# Patient Record
Sex: Female | Born: 1935
Health system: Southern US, Community
[De-identification: ages and names within clinical notes are randomized; demographics above are authoritative.]

## PROBLEM LIST (undated history)

## (undated) DIAGNOSIS — Z8619 Personal history of other infectious and parasitic diseases: Secondary | ICD-10-CM

## (undated) DIAGNOSIS — D649 Anemia, unspecified: Secondary | ICD-10-CM

## (undated) DIAGNOSIS — M1990 Unspecified osteoarthritis, unspecified site: Secondary | ICD-10-CM

## (undated) DIAGNOSIS — E7800 Pure hypercholesterolemia, unspecified: Secondary | ICD-10-CM

## (undated) DIAGNOSIS — I10 Essential (primary) hypertension: Secondary | ICD-10-CM

## (undated) DIAGNOSIS — F419 Anxiety disorder, unspecified: Secondary | ICD-10-CM

## (undated) HISTORY — DX: Anemia, unspecified: D64.9

## (undated) HISTORY — PX: CATARACT EXTRACTION: SUR2

---

## 2004-11-30 ENCOUNTER — Ambulatory Visit (HOSPITAL_COMMUNITY): Admission: RE | Admit: 2004-11-30 | Discharge: 2004-11-30 | Payer: Self-pay | Admitting: Ophthalmology

## 2008-01-07 ENCOUNTER — Ambulatory Visit: Payer: Self-pay | Admitting: Cardiology

## 2008-03-26 ENCOUNTER — Ambulatory Visit: Payer: Self-pay | Admitting: Cardiology

## 2010-07-11 NOTE — Assessment & Plan Note (Signed)
[facility]                          [facility] OFFICE NOTE   NAME:Alison Roberson, Alison Roberson                   MRN:          [id number]  DATE:03/26/2008                            DOB:          02/14/1932    PRIMARY CARDIOLOGIST:  Learta Codding, MD, [facility]   REASON FOR VISIT:  Post hospital followup.   The patient presents to our clinic for the first time, after being  referred to [place] in consultation here at [facility] this past  November.  She presented with chest pain, deemed atypical by Dr. Andee Lineman,  and ruled out for myocardial infarction.  She was thus referred for an  adenosine stress Cardiolite, which was felt to be a low-risk study with  question of a very small area of possible apical ischemia; EF 72%.   Given this finding, and in the context of symptoms that were felt to be  atypical, Dr. Andee Lineman recommended clinic followup with the addition of  p.r.n. nitroglycerin.   Alison Roberson now presents for her post hospital followup.  She has not  had any recurrent symptoms of chest discomfort.  She has not smoked in  10 years.  She remains quite active and denies any exertion-related  symptoms.  Of note, she did not receive a prescription for nitroglycerin  and is also not on prophylactic aspirin.   CURRENT MEDICATIONS:  1. Lisinopril/HCTZ 10/12 0.5 daily.  2. Methotrexate 10 mg weekly.  3. Simvastatin 10 daily.  4. Amlodipine 10 daily.   PHYSICAL EXAMINATION:  VITAL SIGNS:  Blood pressure 118/66, pulse 76,  regular weight 135.  GENERAL:  A 77 year old female, sitting upright, no distress.  HEENT:  Normocephalic, atraumatic.  NECK:  Palpable, bilateral carotid pulses without bruits.  LUNGS:  Diminished breath sounds with faint expiratory wheezes.  HEART:  Regular rate and rhythm.  No significant murmurs.  ABDOMEN:  Benign.  EXTREMITIES:  No edema.  NEUROLOGIC:  No focal deficit.   IMPRESSION:  1. Status post atypical chest pain.   a.     Low-risk adenosine stress Cardiolite; EF 62%, November 2009.  2. COPD/history of tobacco.  3. Hypertension, well controlled.  4. Rheumatoid arthritis.  5. Dyslipidemia.   PLAN:  1. The patient is advised to start low-dose aspirin for primary      prevention.  We will also provide her with a prescription for      p.r.n. nitroglycerin, as previously recommended.  2. No further cardiac testing indicated at this point in time.  We      will, therefore, have the patient return to Dr. Lewayne Bunting on an as      needed basis.  She is to maintain regular scheduled followup with      Dr. Olena Leatherwood.      Gene Serpe, PA-C  Electronically Signed      Learta Codding, MD,FACC  Electronically Signed   GS/MedQ  DD: 03/26/2008  DT: 03/27/2008  Job #: [id number]

## 2010-07-14 NOTE — Op Note (Signed)
NAMEMYRAKLE, Alison Roberson            ACCOUNT NO.:  [id number]   MEDICAL RECORD NO.:  [id number]          PATIENT TYPE:  AMB   LOCATION:  DAY                           FACILITY:  [facility]   PHYSICIAN:  Trish Fountain, MD    DATE OF BIRTH:  1932/01/30   DATE OF PROCEDURE:  11/30/2004  DATE OF DISCHARGE:                                 OPERATIVE REPORT   /PREOPERATIVE DIAGNOSIS:  Cataract, left eye.   POSTOPERATIVE DIAGNOSIS:  Cataract, left eye.   SURGERY:  Kelman phacoemulsification, left eye, with posterior chamber  intraocular lens, left eye.   ANESTHESIA:  MAC with topical anesthesia of the left eye.   SURGEON:  Trish Fountain, MD   SPECIMENS:  None.   COMPLICATIONS:  None.   HISTORY:  This is a 77 year old female who has slowly progressive decrease  in vision in the left eye.   LENS MODEL:  AMO CLRFLXC, 21.0 diopter lens, serial #[id number].   DESCRIPTION OF PROCEDURE:  In the preoperative area, the patient had  Cyclogyl and Neo-Synephrine drops in the left eye in order to dilate the eye  along with Tetracaine to help anesthetize the eye.  Once the patient's left  eye was dilated, the patient was taken to the operating room and prepped.  The left eye was prepped and draped in the usual sterile manner.  A lid  speculum was placed in the left eye, and 2% Xylocaine jelly was placed in  the left eye as well.  A paracentesis was made through clear cornea at the  limbus at approximately the 5 o'clock position of the left eye.  Nonpreserved Xylocaine 1% 1 cc was placed into the anterior chamber for one  minute.  Viscoat was then used to fill the anterior chamber.  Using a 2.75  mm blade at the 3 o'clock position, an incision into the anterior chamber  was made through clear cornea near the limbus.  Viscoat was again used to  reform the anterior chamber.  A 25 gauge bent capsulotomy needle was used to  begin the capsulorrhexis through the anterior capsule of the lens.  Utrata  forceps were used to make a 360 degree anterior capsulorrhexis.  A Chang 27  gauge irrigating cannula was used to hydrodissect and hydrodelineate the  nucleus.  Once hydrodissection and hydrodelineation was carried out, [facility]  phacoemulsification was used to make a deep groove in the lens nucleus.  The  lens was rotated 360 degrees and divided into four quadrants using deep  grooves made by phacoemulsification with the [facility] phacoemulsification tip.  The nucleus was then divided using the phaco tip and the nucleus  manipulator.  The nuclear quadrants were then removed using  phacoemulsification.  The irrigation aspiration was then used to remove the  remainder of the cortex. The anterior chamber and posterior capsule was  filled with Provisc, and the 3 o'clock position incision was slightly  widened, using the same 2.75 mm blade that was initially used to make the  incision.  An intraocular lens was placed in the shooter, and this was  placed in the eye, followed  by placement of the trailing haptic into the  posterior capsule, using the Kugelan.  Irrigation/aspiration was then used  to remove Provisc from the anterior chamber and the posterior capsule.  BSS  on a syringe was then used to hydrate the cornea at the 3 o'clock incision  site.  The incision site was then checked for water tightness, using a Weck-  cel.  Half-strength Betadine solution was placed, 1 drop, in the inner  canthus, and 1 drop in the outer canthus.  After one minute, this was rinsed  from the eye.  Drops were placed in the eye, Vigamox, followed by Nevanac  followed by Econopred.  A shield was placed over the patient's left eye, and  the patient was sent to the recovery room in satisfactory condition.      Trish Fountain, MD  Electronically Signed     PVK/MEDQ  D:  12/01/2004  T:  12/01/2004  Job:  [id number]

## 2015-03-24 DIAGNOSIS — M25511 Pain in right shoulder: Secondary | ICD-10-CM | POA: Diagnosis not present

## 2015-03-24 DIAGNOSIS — Z6822 Body mass index (BMI) 22.0-22.9, adult: Secondary | ICD-10-CM | POA: Diagnosis not present

## 2015-04-15 DIAGNOSIS — Z6822 Body mass index (BMI) 22.0-22.9, adult: Secondary | ICD-10-CM | POA: Diagnosis not present

## 2015-04-15 DIAGNOSIS — M25511 Pain in right shoulder: Secondary | ICD-10-CM | POA: Diagnosis not present

## 2015-04-15 DIAGNOSIS — J440 Chronic obstructive pulmonary disease with acute lower respiratory infection: Secondary | ICD-10-CM | POA: Diagnosis not present

## 2015-05-23 DIAGNOSIS — M4722 Other spondylosis with radiculopathy, cervical region: Secondary | ICD-10-CM | POA: Diagnosis not present

## 2015-05-23 DIAGNOSIS — M5030 Other cervical disc degeneration, unspecified cervical region: Secondary | ICD-10-CM | POA: Diagnosis not present

## 2015-05-23 DIAGNOSIS — M67911 Unspecified disorder of synovium and tendon, right shoulder: Secondary | ICD-10-CM | POA: Diagnosis not present

## 2015-06-04 DIAGNOSIS — Z79899 Other long term (current) drug therapy: Secondary | ICD-10-CM | POA: Diagnosis not present

## 2015-06-04 DIAGNOSIS — E875 Hyperkalemia: Secondary | ICD-10-CM | POA: Diagnosis not present

## 2015-06-04 DIAGNOSIS — M1990 Unspecified osteoarthritis, unspecified site: Secondary | ICD-10-CM | POA: Diagnosis not present

## 2015-06-04 DIAGNOSIS — J449 Chronic obstructive pulmonary disease, unspecified: Secondary | ICD-10-CM | POA: Diagnosis not present

## 2015-06-04 DIAGNOSIS — N178 Other acute kidney failure: Secondary | ICD-10-CM | POA: Diagnosis not present

## 2015-06-04 DIAGNOSIS — D638 Anemia in other chronic diseases classified elsewhere: Secondary | ICD-10-CM | POA: Diagnosis not present

## 2015-06-04 DIAGNOSIS — I10 Essential (primary) hypertension: Secondary | ICD-10-CM | POA: Diagnosis not present

## 2015-06-04 DIAGNOSIS — Z780 Asymptomatic menopausal state: Secondary | ICD-10-CM | POA: Diagnosis not present

## 2015-06-04 DIAGNOSIS — J441 Chronic obstructive pulmonary disease with (acute) exacerbation: Secondary | ICD-10-CM | POA: Diagnosis not present

## 2015-06-04 DIAGNOSIS — N179 Acute kidney failure, unspecified: Secondary | ICD-10-CM | POA: Diagnosis not present

## 2015-06-04 DIAGNOSIS — R42 Dizziness and giddiness: Secondary | ICD-10-CM | POA: Diagnosis not present

## 2015-06-04 DIAGNOSIS — Z7982 Long term (current) use of aspirin: Secondary | ICD-10-CM | POA: Diagnosis not present

## 2015-06-04 DIAGNOSIS — E860 Dehydration: Secondary | ICD-10-CM | POA: Diagnosis not present

## 2015-06-04 DIAGNOSIS — M150 Primary generalized (osteo)arthritis: Secondary | ICD-10-CM | POA: Diagnosis not present

## 2015-06-14 DIAGNOSIS — Z6822 Body mass index (BMI) 22.0-22.9, adult: Secondary | ICD-10-CM | POA: Diagnosis not present

## 2015-06-14 DIAGNOSIS — N2889 Other specified disorders of kidney and ureter: Secondary | ICD-10-CM | POA: Diagnosis not present

## 2015-06-15 DIAGNOSIS — N2889 Other specified disorders of kidney and ureter: Secondary | ICD-10-CM | POA: Diagnosis not present

## 2015-06-15 DIAGNOSIS — Z79899 Other long term (current) drug therapy: Secondary | ICD-10-CM | POA: Diagnosis not present

## 2015-06-20 DIAGNOSIS — M5030 Other cervical disc degeneration, unspecified cervical region: Secondary | ICD-10-CM | POA: Diagnosis not present

## 2015-06-20 DIAGNOSIS — M4722 Other spondylosis with radiculopathy, cervical region: Secondary | ICD-10-CM | POA: Diagnosis not present

## 2015-06-20 DIAGNOSIS — M67911 Unspecified disorder of synovium and tendon, right shoulder: Secondary | ICD-10-CM | POA: Diagnosis not present

## 2015-06-25 ENCOUNTER — Encounter (HOSPITAL_COMMUNITY): Payer: Self-pay | Admitting: Emergency Medicine

## 2015-06-25 ENCOUNTER — Emergency Department (HOSPITAL_COMMUNITY)
Admission: EM | Admit: 2015-06-25 | Discharge: 2015-06-25 | Disposition: A | Discharge status: Home / Self Care | Payer: Medicare Other | Attending: Emergency Medicine | Admitting: Emergency Medicine

## 2015-06-25 ENCOUNTER — Emergency Department (HOSPITAL_COMMUNITY): Payer: Medicare Other

## 2015-06-25 DIAGNOSIS — Z79899 Other long term (current) drug therapy: Secondary | ICD-10-CM | POA: Diagnosis not present

## 2015-06-25 DIAGNOSIS — N281 Cyst of kidney, acquired: Secondary | ICD-10-CM | POA: Diagnosis not present

## 2015-06-25 DIAGNOSIS — I10 Essential (primary) hypertension: Secondary | ICD-10-CM | POA: Diagnosis not present

## 2015-06-25 DIAGNOSIS — M549 Dorsalgia, unspecified: Secondary | ICD-10-CM | POA: Diagnosis present

## 2015-06-25 DIAGNOSIS — R1010 Upper abdominal pain, unspecified: Secondary | ICD-10-CM | POA: Diagnosis not present

## 2015-06-25 DIAGNOSIS — Z87891 Personal history of nicotine dependence: Secondary | ICD-10-CM | POA: Diagnosis not present

## 2015-06-25 DIAGNOSIS — R1013 Epigastric pain: Secondary | ICD-10-CM | POA: Diagnosis not present

## 2015-06-25 HISTORY — DX: Essential (primary) hypertension: I10

## 2015-06-25 HISTORY — DX: Pure hypercholesterolemia, unspecified: E78.00

## 2015-06-25 HISTORY — DX: Unspecified osteoarthritis, unspecified site: M19.90

## 2015-06-25 LAB — URINALYSIS, ROUTINE W REFLEX MICROSCOPIC
Glucose, UA: NEGATIVE mg/dL
Hgb urine dipstick: NEGATIVE
Ketones, ur: 15 mg/dL — AB
Nitrite: NEGATIVE
Protein, ur: 100 mg/dL — AB
Specific Gravity, Urine: 1.025 (ref 1.005–1.030)
pH: 5.5 (ref 5.0–8.0)

## 2015-06-25 LAB — COMPREHENSIVE METABOLIC PANEL
ALT: 14 U/L (ref 14–54)
AST: 21 U/L (ref 15–41)
Albumin: 4 g/dL (ref 3.5–5.0)
Alkaline Phosphatase: 66 U/L (ref 38–126)
Anion gap: 9 (ref 5–15)
BUN: 32 mg/dL — ABNORMAL HIGH (ref 6–20)
CO2: 19 mmol/L — ABNORMAL LOW (ref 22–32)
Calcium: 9.4 mg/dL (ref 8.9–10.3)
Chloride: 105 mmol/L (ref 101–111)
Creatinine, Ser: 1.93 mg/dL — ABNORMAL HIGH (ref 0.44–1.00)
GFR calc Af Amer: 27 mL/min — ABNORMAL LOW (ref >60)
GFR calc non Af Amer: 23 mL/min — ABNORMAL LOW (ref >60)
Glucose, Bld: 142 mg/dL — ABNORMAL HIGH (ref 65–99)
Potassium: 5.2 mmol/L — ABNORMAL HIGH (ref 3.5–5.1)
Sodium: 133 mmol/L — ABNORMAL LOW (ref 135–145)
Total Bilirubin: 0.3 mg/dL (ref 0.3–1.2)
Total Protein: 7.7 g/dL (ref 6.5–8.1)

## 2015-06-25 LAB — CBC WITH DIFFERENTIAL/PLATELET
Basophils Absolute: 0 10*3/uL (ref 0.0–0.1)
Basophils Relative: 0 %
Eosinophils Absolute: 0.1 10*3/uL (ref 0.0–0.7)
Eosinophils Relative: 1 %
HCT: 34 % — ABNORMAL LOW (ref 36.0–46.0)
Hemoglobin: 11.1 g/dL — ABNORMAL LOW (ref 12.0–15.0)
Lymphocytes Relative: 6 %
Lymphs Abs: 0.4 10*3/uL — ABNORMAL LOW (ref 0.7–4.0)
MCH: 31.4 pg (ref 26.0–34.0)
MCHC: 32.6 g/dL (ref 30.0–36.0)
MCV: 96.3 fL (ref 78.0–100.0)
Monocytes Absolute: 0.9 10*3/uL (ref 0.1–1.0)
Monocytes Relative: 13 %
Neutro Abs: 5.5 10*3/uL (ref 1.7–7.7)
Neutrophils Relative %: 80 %
Platelets: 417 10*3/uL — ABNORMAL HIGH (ref 150–400)
RBC: 3.53 MIL/uL — ABNORMAL LOW (ref 3.87–5.11)
RDW: 13.5 % (ref 11.5–15.5)
WBC: 6.9 10*3/uL (ref 4.0–10.5)

## 2015-06-25 LAB — URINE MICROSCOPIC-ADD ON

## 2015-06-25 LAB — LIPASE, BLOOD: Lipase: 35 U/L (ref 11–51)

## 2015-06-25 MED ORDER — HYDROMORPHONE HCL 1 MG/ML IJ SOLN
0.7500 mg | Freq: Once | INTRAMUSCULAR | Status: AC
  Administered 2015-06-25: 0.75 mg via INTRAVENOUS
  Filled 2015-06-25: qty 1

## 2015-06-25 MED ORDER — ONDANSETRON HCL 4 MG/2ML IJ SOLN
4.0000 mg | Freq: Once | INTRAMUSCULAR | Status: AC
  Administered 2015-06-25: 4 mg via INTRAVENOUS
  Filled 2015-06-25: qty 2

## 2015-06-25 MED ORDER — SODIUM CHLORIDE 0.9 % IV BOLUS (SEPSIS)
1000.0000 mL | Freq: Once | INTRAVENOUS | Status: AC
  Administered 2015-06-25: 1000 mL via INTRAVENOUS

## 2015-06-25 MED ORDER — DIATRIZOATE MEGLUMINE & SODIUM 66-10 % PO SOLN
ORAL | Status: AC
  Administered 2015-06-25: 30 mL
  Filled 2015-06-25: qty 30

## 2015-06-25 MED ORDER — TRAMADOL HCL 50 MG PO TABS: 50.0000 mg | ORAL_TABLET | Freq: Three times a day (TID) | ORAL | Status: DC | PRN

## 2015-06-25 MED ORDER — HYDROMORPHONE HCL 1 MG/ML IJ SOLN
0.5000 mg | Freq: Once | INTRAMUSCULAR | Status: AC
  Administered 2015-06-25: 0.5 mg via INTRAVENOUS
  Filled 2015-06-25: qty 1

## 2015-06-25 NOTE — Discharge Instructions (Signed)

## 2015-06-25 NOTE — ED Notes (Signed)
Patient c/o mid back pain that radiates into abd. Denies any nausea, vomiting, diarrhea, or urinary symptoms. Patient reports having a BM everyday but has not had one since Thursday.

## 2015-07-04 DIAGNOSIS — B028 Zoster with other complications: Secondary | ICD-10-CM | POA: Diagnosis not present

## 2015-07-04 DIAGNOSIS — Z6820 Body mass index (BMI) 20.0-20.9, adult: Secondary | ICD-10-CM | POA: Diagnosis not present

## 2015-07-04 NOTE — ED Provider Notes (Signed)
CSN: [id number]     Arrival date & time 06/25/15  1542 History   First MD Initiated Contact with Patient 06/25/15 1600     Chief Complaint  Patient presents with  . Back Pain     (Consider location/radiation/quality/duration/timing/severity/associated sxs/prior Treatment) HPI    82 year old female with abdominal pain. Pain is afebrile abdomen and radiates into the mid back. Onset was 2 days ago. Persistent since then. Progressive. She normally has pretty regular bowel movements but has not had one since Thursday. She questions were pain may be related to this. No nausea or vomiting. No urinary complaints. No fevers or chills. Does not really feel distended. Has not tried taking anything for her symptoms.  Past Medical History  Diagnosis Date  . Hypertension   . High cholesterol   . Arthritis    Past Surgical History  Procedure Laterality Date  . Cataract extraction     Family History  Problem Relation Age of Onset  . Diabetes Sister   . Cancer Brother    Social History  Substance Use Topics  . Smoking status: Former Smoker -- 0.02 packs/day for 40 years    Types: Cigarettes    Quit date: 02/26/1997  . Smokeless tobacco: Never Used  . Alcohol Use: No   OB History    Gravida Para Term Preterm AB TAB SAB Ectopic Multiple Living   '5 5 5       5     '$ Review of Systems  All systems reviewed and negative, other than as noted in HPI.   Allergies  Review of patient's allergies indicates no known allergies.  Home Medications   Prior to Admission medications   Medication Sig Start Date End Date Taking? Authorizing Provider  acetaminophen (TYLENOL) 650 MG CR tablet Take 650 mg by mouth every 8 (eight) hours as needed for pain.   Yes Historical Provider, MD  albuterol (PROAIR HFA) 108 (90 Base) MCG/ACT inhaler Inhale 1-2 puffs into the lungs every 6 (six) hours as needed for wheezing or shortness of breath.   Yes Historical Provider, MD  alendronate (FOSAMAX) 70 MG tablet  Take 70 mg by mouth every Monday. 04/15/15  Yes Historical Provider, MD  amLODipine (NORVASC) 10 MG tablet Take 10 mg by mouth every morning.   Yes Historical Provider, MD  aspirin EC 81 MG tablet Take 81 mg by mouth every morning.   Yes Historical Provider, MD  folic acid (FOLVITE) 1 MG tablet Take 1 mg by mouth every morning.   Yes Historical Provider, MD  metoprolol (LOPRESSOR) 50 MG tablet Take 50 mg by mouth 2 (two) times daily.   Yes Historical Provider, MD  simvastatin (ZOCOR) 10 MG tablet Take 10 mg by mouth every morning.   Yes Historical Provider, MD  tiZANidine (ZANAFLEX) 4 MG tablet Take 4 mg by mouth daily as needed for muscle spasms.   Yes Historical Provider, MD  traMADol (ULTRAM) 50 MG tablet Take 1 tablet (50 mg total) by mouth every 8 (eight) hours as needed for moderate pain or severe pain. 06/25/15   Virgel Manifold, MD   BP 118/71 mmHg  Pulse 88  Temp(Src) 98.3 F (36.8 C) (Oral)  Resp 15  Ht 5' (1.524 m)  Wt 110 lb (49.896 kg)  BMI 21.48 kg/m2  SpO2 95% Physical Exam  Constitutional: She appears well-developed and well-nourished. No distress.  HENT:  Head: Normocephalic and atraumatic.  Eyes: Conjunctivae are normal. Right eye exhibits no discharge. Left eye exhibits no discharge.  Neck: Neck supple.  Cardiovascular: Normal rate, regular rhythm and normal heart sounds.  Exam reveals no gallop and no friction rub.   No murmur heard. Pulmonary/Chest: Effort normal and breath sounds normal. No respiratory distress.  Abdominal: Soft. She exhibits no distension. There is tenderness.  Mild to moderate tenderness in the upper abdomen, slightly worse in the epigastrium.  Musculoskeletal: She exhibits no edema or tenderness.  Neurological: She is alert.  Skin: Skin is warm and dry.  Psychiatric: She has a normal mood and affect. Her behavior is normal. Thought content normal.  Nursing note and vitals reviewed.   ED Course  Procedures (including critical care time) Labs  Review Labs Reviewed  CBC WITH DIFFERENTIAL/PLATELET - Abnormal; Notable for the following:    RBC 3.53 (*)    Hemoglobin 11.1 (*)    HCT 34.0 (*)    Platelets 417 (*)    Lymphs Abs 0.4 (*)    All other components within normal limits  COMPREHENSIVE METABOLIC PANEL - Abnormal; Notable for the following:    Sodium 133 (*)    Potassium 5.2 (*)    CO2 19 (*)    Glucose, Bld 142 (*)    BUN 32 (*)    Creatinine, Ser 1.93 (*)    GFR calc non Af Amer 23 (*)    GFR calc Af Amer 27 (*)    All other components within normal limits  URINALYSIS, ROUTINE W REFLEX MICROSCOPIC (NOT AT [facility]) - Abnormal; Notable for the following:    Bilirubin Urine SMALL (*)    Ketones, ur 15 (*)    Protein, ur 100 (*)    Leukocytes, UA TRACE (*)    All other components within normal limits  URINE MICROSCOPIC-ADD ON - Abnormal; Notable for the following:    Squamous Epithelial / LPF 0-5 (*)    Bacteria, UA FEW (*)    Casts GRANULAR CAST (*)    All other components within normal limits  LIPASE, BLOOD    Imaging Review No results found.   Ct Abdomen Pelvis Wo Contrast  06/25/2015  CLINICAL DATA:  Acute onset of mid back pain, radiating to the abdomen. Initial encounter. EXAM: CT ABDOMEN AND PELVIS WITHOUT CONTRAST TECHNIQUE: Multidetector CT imaging of the abdomen and pelvis was performed following the standard protocol without IV contrast. COMPARISON:  None. FINDINGS: Mild scarring is noted at the right middle lung lobe. A small hiatal hernia is noted, filled with contrast. Diffuse coronary artery calcifications are seen. Calcification is also noted at the aortic valve. The liver and spleen are unremarkable in appearance. The gallbladder is within normal limits. The pancreas and adrenal glands are unremarkable. A 1.3 cm cyst is noted at the interpole region of the left kidney. There is no evidence of hydronephrosis. No renal or ureteral stones are seen. Mild nonspecific perinephric stranding is noted bilaterally.  No free fluid is identified. The small bowel is unremarkable in appearance. The stomach is within normal limits. No acute vascular abnormalities are seen. Scattered calcification is seen along the abdominal aorta and its branches. The appendix is normal in caliber, without evidence of appendicitis. The cecum is flipped anteriorly. The transverse colon is mildly redundant. The colon is grossly unremarkable in appearance. The bladder is mildly distended and grossly unremarkable. The uterus is unremarkable in appearance. The ovaries are grossly symmetric. No suspicious adnexal masses are seen. No inguinal lymphadenopathy is seen. No acute osseous abnormalities are identified. There is mild grade 1 retrolisthesis of L1 on  L2, of L2 on L3, and of L3 on L4. Multilevel vacuum phenomenon is noted along the lumbar spine. IMPRESSION: 1. No acute abnormality seen to explain the patient's symptoms. 2. Scattered calcification along the abdominal aorta and its branches. 3. Diffuse coronary artery calcifications seen. Calcification at the aortic valve. 4. Small hiatal hernia noted, filled with contrast. 5. Small left renal cyst seen. 6. Mild degenerative change along the lumbar spine. Electronically Signed   By: Garald Balding M.D.   On: 06/25/2015 19:09   I have personally reviewed and evaluated these images and lab results as part of my medical decision-making.   EKG Interpretation None      MDM   Final diagnoses:  Pain of upper abdomen        Virgel Manifold, MD 07/04/15 1525

## 2015-09-15 DIAGNOSIS — M818 Other osteoporosis without current pathological fracture: Secondary | ICD-10-CM | POA: Diagnosis not present

## 2015-09-15 DIAGNOSIS — I10 Essential (primary) hypertension: Secondary | ICD-10-CM | POA: Diagnosis not present

## 2015-09-15 DIAGNOSIS — B028 Zoster with other complications: Secondary | ICD-10-CM | POA: Diagnosis not present

## 2015-09-15 DIAGNOSIS — E784 Other hyperlipidemia: Secondary | ICD-10-CM | POA: Diagnosis not present

## 2015-12-15 DIAGNOSIS — E784 Other hyperlipidemia: Secondary | ICD-10-CM | POA: Diagnosis not present

## 2015-12-15 DIAGNOSIS — Z1389 Encounter for screening for other disorder: Secondary | ICD-10-CM | POA: Diagnosis not present

## 2015-12-15 DIAGNOSIS — N182 Chronic kidney disease, stage 2 (mild): Secondary | ICD-10-CM | POA: Diagnosis not present

## 2015-12-15 DIAGNOSIS — B028 Zoster with other complications: Secondary | ICD-10-CM | POA: Diagnosis not present

## 2015-12-15 DIAGNOSIS — M818 Other osteoporosis without current pathological fracture: Secondary | ICD-10-CM | POA: Diagnosis not present

## 2015-12-15 DIAGNOSIS — I10 Essential (primary) hypertension: Secondary | ICD-10-CM | POA: Diagnosis not present

## 2015-12-15 DIAGNOSIS — Z0000 Encounter for general adult medical examination without abnormal findings: Secondary | ICD-10-CM | POA: Diagnosis not present

## 2015-12-26 DIAGNOSIS — H2511 Age-related nuclear cataract, right eye: Secondary | ICD-10-CM | POA: Diagnosis not present

## 2015-12-26 DIAGNOSIS — H538 Other visual disturbances: Secondary | ICD-10-CM | POA: Diagnosis not present

## 2015-12-28 DIAGNOSIS — I10 Essential (primary) hypertension: Secondary | ICD-10-CM | POA: Diagnosis not present

## 2015-12-28 DIAGNOSIS — E784 Other hyperlipidemia: Secondary | ICD-10-CM | POA: Diagnosis not present

## 2016-01-27 DIAGNOSIS — E784 Other hyperlipidemia: Secondary | ICD-10-CM | POA: Diagnosis not present

## 2016-01-27 DIAGNOSIS — I10 Essential (primary) hypertension: Secondary | ICD-10-CM | POA: Diagnosis not present

## 2016-02-29 DIAGNOSIS — E784 Other hyperlipidemia: Secondary | ICD-10-CM | POA: Diagnosis not present

## 2016-02-29 DIAGNOSIS — I10 Essential (primary) hypertension: Secondary | ICD-10-CM | POA: Diagnosis not present

## 2016-03-22 DIAGNOSIS — M818 Other osteoporosis without current pathological fracture: Secondary | ICD-10-CM | POA: Diagnosis not present

## 2016-03-22 DIAGNOSIS — Z0000 Encounter for general adult medical examination without abnormal findings: Secondary | ICD-10-CM | POA: Diagnosis not present

## 2016-03-22 DIAGNOSIS — I10 Essential (primary) hypertension: Secondary | ICD-10-CM | POA: Diagnosis not present

## 2016-03-22 DIAGNOSIS — N182 Chronic kidney disease, stage 2 (mild): Secondary | ICD-10-CM | POA: Diagnosis not present

## 2016-03-22 DIAGNOSIS — B028 Zoster with other complications: Secondary | ICD-10-CM | POA: Diagnosis not present

## 2016-03-22 DIAGNOSIS — E784 Other hyperlipidemia: Secondary | ICD-10-CM | POA: Diagnosis not present

## 2016-04-13 DIAGNOSIS — I10 Essential (primary) hypertension: Secondary | ICD-10-CM | POA: Diagnosis not present

## 2016-04-13 DIAGNOSIS — E784 Other hyperlipidemia: Secondary | ICD-10-CM | POA: Diagnosis not present

## 2016-05-11 DIAGNOSIS — E784 Other hyperlipidemia: Secondary | ICD-10-CM | POA: Diagnosis not present

## 2016-05-11 DIAGNOSIS — I10 Essential (primary) hypertension: Secondary | ICD-10-CM | POA: Diagnosis not present

## 2016-06-11 DIAGNOSIS — I10 Essential (primary) hypertension: Secondary | ICD-10-CM | POA: Diagnosis not present

## 2016-06-11 DIAGNOSIS — E784 Other hyperlipidemia: Secondary | ICD-10-CM | POA: Diagnosis not present

## 2016-06-22 DIAGNOSIS — I10 Essential (primary) hypertension: Secondary | ICD-10-CM | POA: Diagnosis not present

## 2016-06-22 DIAGNOSIS — M818 Other osteoporosis without current pathological fracture: Secondary | ICD-10-CM | POA: Diagnosis not present

## 2016-06-22 DIAGNOSIS — Z0000 Encounter for general adult medical examination without abnormal findings: Secondary | ICD-10-CM | POA: Diagnosis not present

## 2016-06-22 DIAGNOSIS — E784 Other hyperlipidemia: Secondary | ICD-10-CM | POA: Diagnosis not present

## 2016-06-22 DIAGNOSIS — N182 Chronic kidney disease, stage 2 (mild): Secondary | ICD-10-CM | POA: Diagnosis not present

## 2016-06-22 DIAGNOSIS — Z1389 Encounter for screening for other disorder: Secondary | ICD-10-CM | POA: Diagnosis not present

## 2016-07-11 DIAGNOSIS — I10 Essential (primary) hypertension: Secondary | ICD-10-CM | POA: Diagnosis not present

## 2016-07-11 DIAGNOSIS — E784 Other hyperlipidemia: Secondary | ICD-10-CM | POA: Diagnosis not present

## 2016-07-19 DIAGNOSIS — I10 Essential (primary) hypertension: Secondary | ICD-10-CM | POA: Diagnosis not present

## 2016-07-19 DIAGNOSIS — Z79899 Other long term (current) drug therapy: Secondary | ICD-10-CM | POA: Diagnosis not present

## 2016-07-19 DIAGNOSIS — Z780 Asymptomatic menopausal state: Secondary | ICD-10-CM | POA: Diagnosis not present

## 2016-07-19 DIAGNOSIS — E7800 Pure hypercholesterolemia, unspecified: Secondary | ICD-10-CM | POA: Diagnosis not present

## 2016-07-19 DIAGNOSIS — M810 Age-related osteoporosis without current pathological fracture: Secondary | ICD-10-CM | POA: Diagnosis not present

## 2016-07-19 DIAGNOSIS — Z7982 Long term (current) use of aspirin: Secondary | ICD-10-CM | POA: Diagnosis not present

## 2016-07-19 DIAGNOSIS — Z87891 Personal history of nicotine dependence: Secondary | ICD-10-CM | POA: Diagnosis not present

## 2016-08-01 DIAGNOSIS — E784 Other hyperlipidemia: Secondary | ICD-10-CM | POA: Diagnosis not present

## 2016-08-01 DIAGNOSIS — I10 Essential (primary) hypertension: Secondary | ICD-10-CM | POA: Diagnosis not present

## 2016-09-06 DIAGNOSIS — I10 Essential (primary) hypertension: Secondary | ICD-10-CM | POA: Diagnosis not present

## 2016-09-06 DIAGNOSIS — E784 Other hyperlipidemia: Secondary | ICD-10-CM | POA: Diagnosis not present

## 2016-09-08 IMAGING — CT CT ABD-PELV W/O CM
2 of 4 series · 16 of 46 positions shown, 18 images · non-contrast
Comparison: None.

CLINICAL DATA: Acute onset of mid back pain, radiating to the
abdomen. Initial encounter.

EXAM:
CT ABDOMEN AND PELVIS WITHOUT CONTRAST
TECHNIQUE: Multidetector CT imaging of the abdomen and pelvis was performed
following the standard protocol without IV contrast.

[Series 2: abdomen/pelvis w/o contrast · axial · non-contrast · 0.66mm/px · z∈[-413,-38]mm · 13 of 83 slices shown, 15 images]
[im 4/83  soft-tissue]
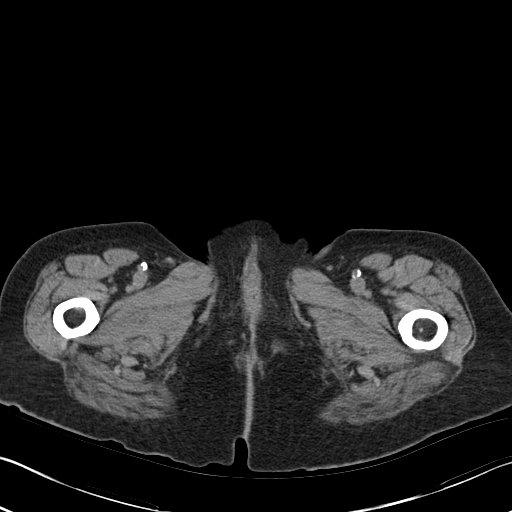
[im 4/83  bone]
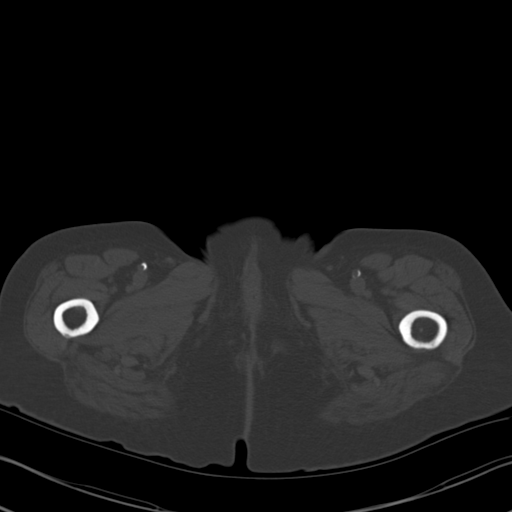
[im 11/83  soft-tissue]
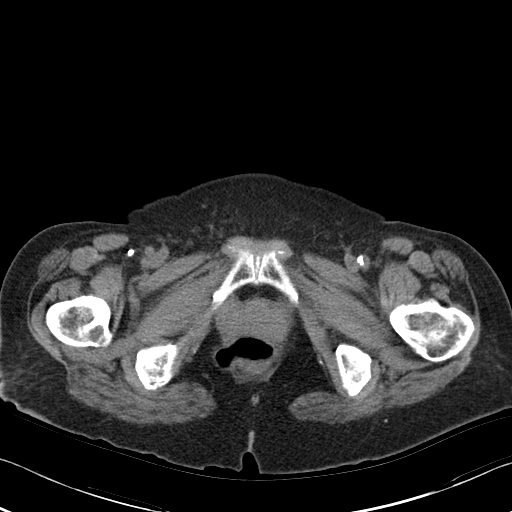
[im 18/83  soft-tissue]
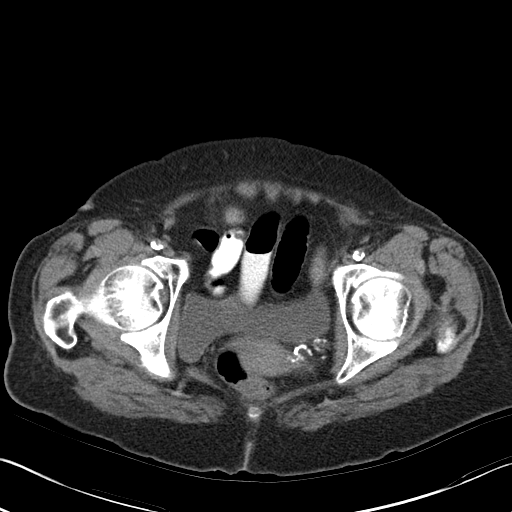
[im 24/83  soft-tissue]
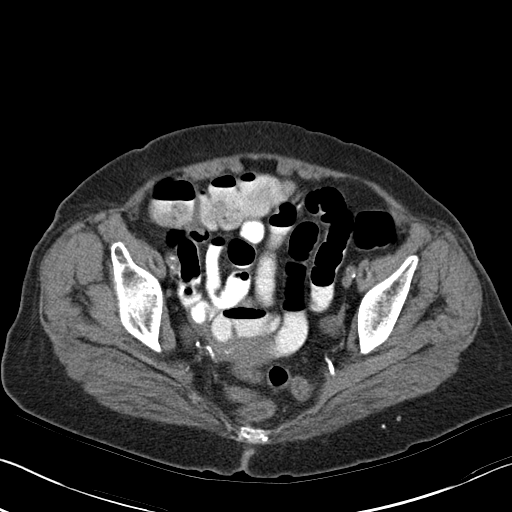
[im 28/83  soft-tissue]
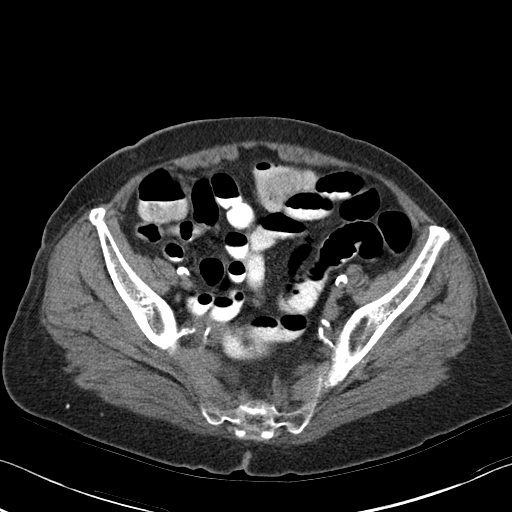
[im 35/83  soft-tissue]
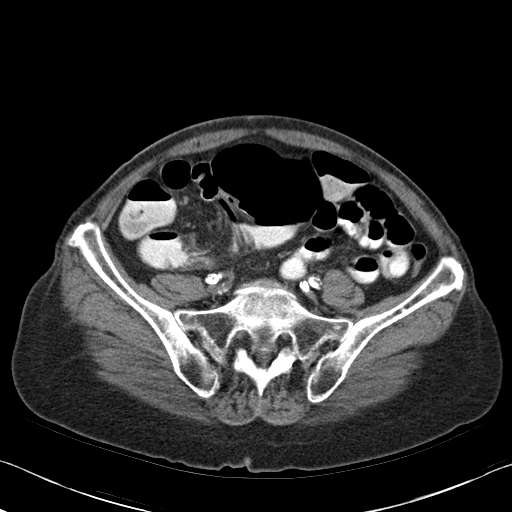
[im 42/83  soft-tissue]
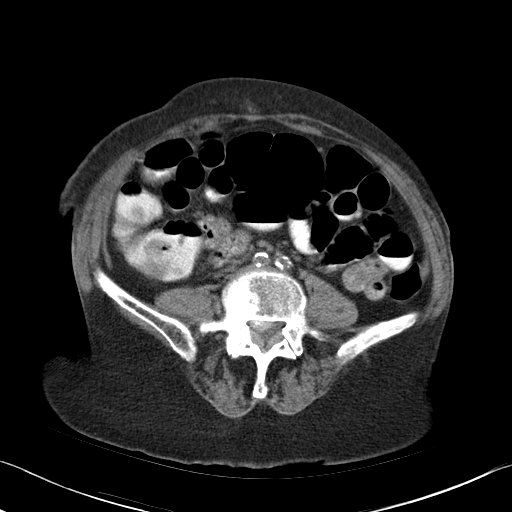
[im 48/83  soft-tissue]
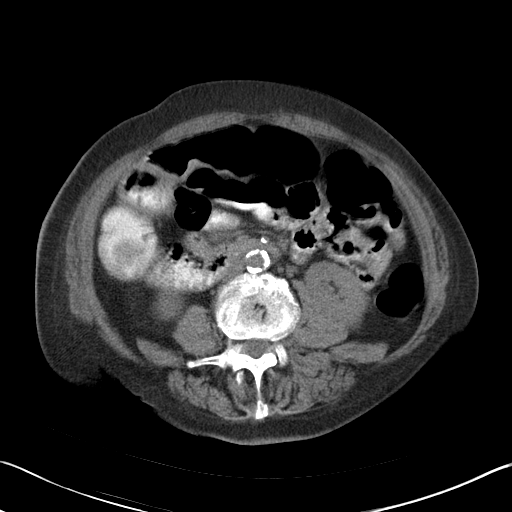
[im 55/83  soft-tissue]
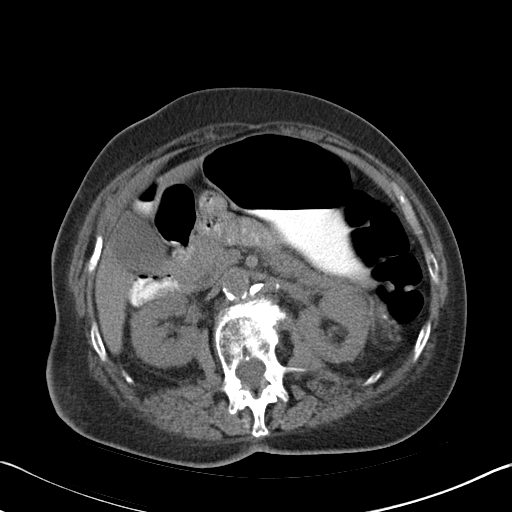
[im 55/83  bone]
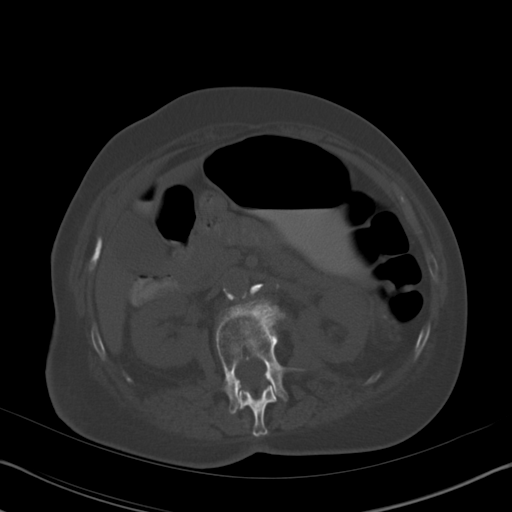
[im 59/83  soft-tissue]
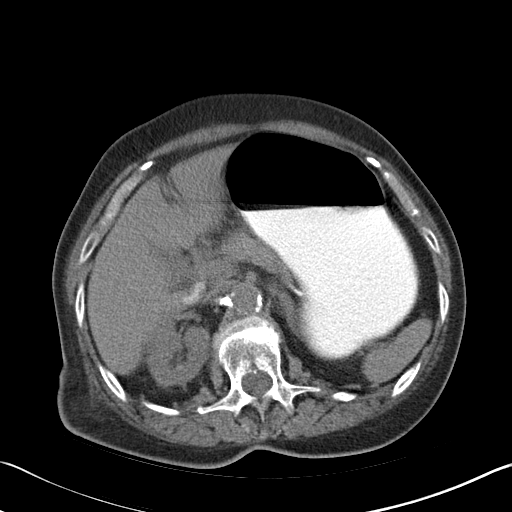
[im 65/83  soft-tissue]
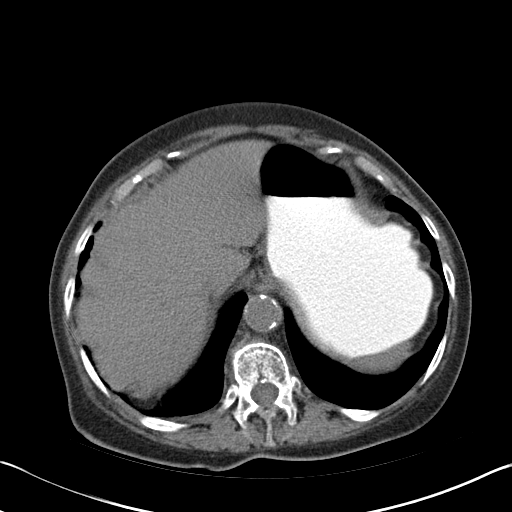
[im 72/83  soft-tissue]
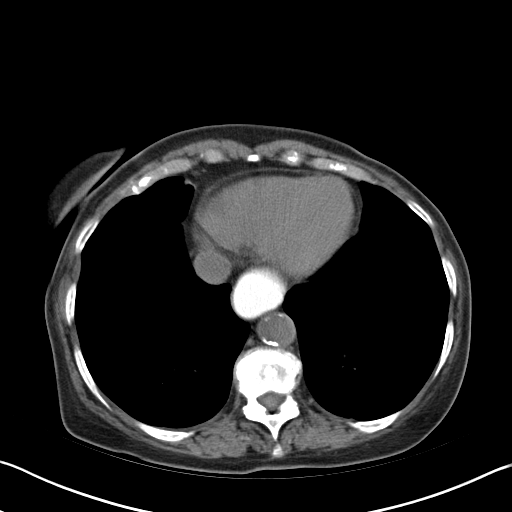
[im 79/83  soft-tissue]
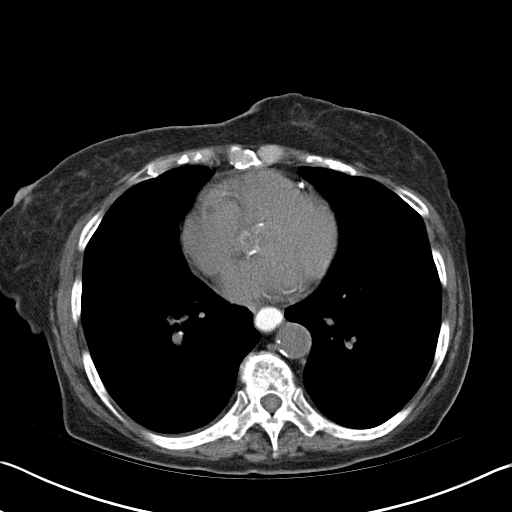

[Series 4: mpr cor (id) · coronal · 0.64mm/px · 3 of 80 slices shown]
[im 27/80  soft-tissue]
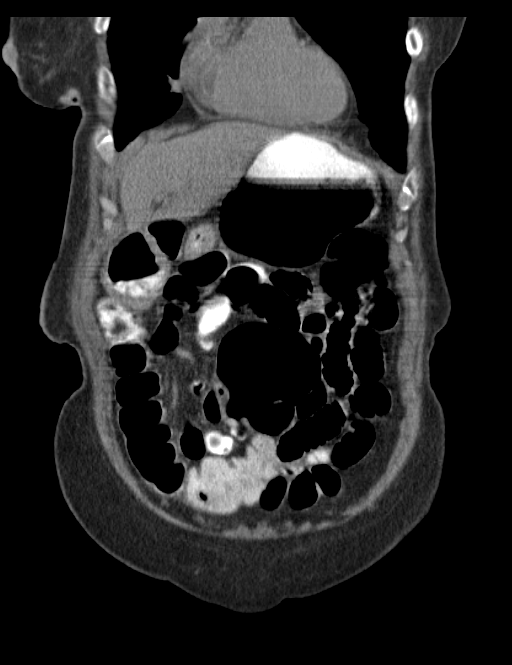
[im 36/80  soft-tissue]
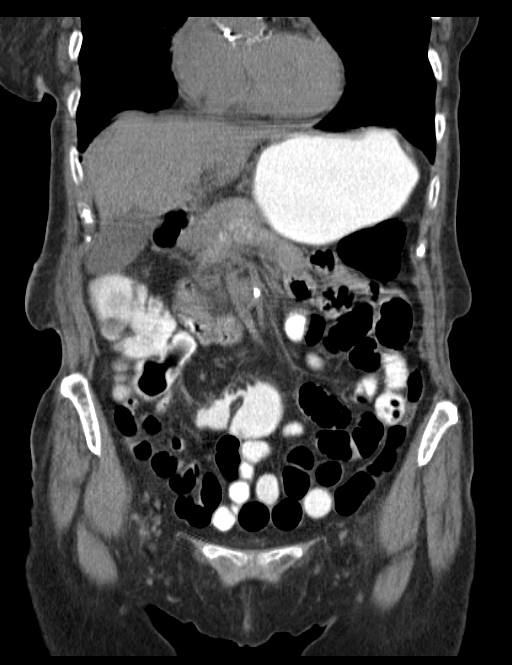
[im 44/80  soft-tissue]
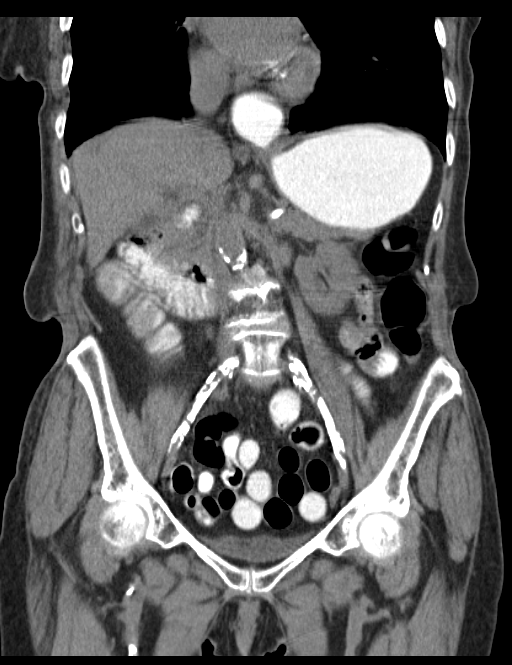

[16 of 46 positions shown; findings below may reference images not displayed]

FINDINGS: Mild scarring is noted at the right middle lung lobe. A small hiatal
hernia is noted, filled with contrast. Diffuse coronary artery
calcifications are seen. Calcification is also noted at the aortic
valve.

The liver and spleen are unremarkable in appearance. The gallbladder
is within normal limits. The pancreas and adrenal glands are
unremarkable.

A 1.3 cm cyst is noted at the interpole region of the left kidney.
There is no evidence of hydronephrosis. No renal or ureteral stones
are seen. Mild nonspecific perinephric stranding is noted
bilaterally.

No free fluid is identified. The small bowel is unremarkable in
appearance. The stomach is within normal limits. No acute vascular
abnormalities are seen. Scattered calcification is seen along the
abdominal aorta and its branches.

The appendix is normal in caliber, without evidence of appendicitis.
The cecum is flipped anteriorly. The transverse colon is mildly
redundant. The colon is grossly unremarkable in appearance.

The bladder is mildly distended and grossly unremarkable. The uterus
is unremarkable in appearance. The ovaries are grossly symmetric. No
suspicious adnexal masses are seen. No inguinal lymphadenopathy is
seen.

No acute osseous abnormalities are identified. There is mild grade 1
retrolisthesis of L1 on L2, of L2 on L3, and of L3 on L4. Multilevel
vacuum phenomenon is noted along the lumbar spine.
IMPRESSION: 1. No acute abnormality seen to explain the patient's symptoms.
2. Scattered calcification along the abdominal aorta and its
branches.
3. Diffuse coronary artery calcifications seen. Calcification at the
aortic valve.
4. Small hiatal hernia noted, filled with contrast.
5. Small left renal cyst seen.
6. Mild degenerative change along the lumbar spine.

## 2016-09-27 DIAGNOSIS — E784 Other hyperlipidemia: Secondary | ICD-10-CM | POA: Diagnosis not present

## 2016-09-27 DIAGNOSIS — I10 Essential (primary) hypertension: Secondary | ICD-10-CM | POA: Diagnosis not present

## 2016-10-02 DIAGNOSIS — I10 Essential (primary) hypertension: Secondary | ICD-10-CM | POA: Diagnosis not present

## 2016-10-02 DIAGNOSIS — M818 Other osteoporosis without current pathological fracture: Secondary | ICD-10-CM | POA: Diagnosis not present

## 2016-10-02 DIAGNOSIS — J440 Chronic obstructive pulmonary disease with acute lower respiratory infection: Secondary | ICD-10-CM | POA: Diagnosis not present

## 2016-10-02 DIAGNOSIS — E784 Other hyperlipidemia: Secondary | ICD-10-CM | POA: Diagnosis not present

## 2016-10-02 DIAGNOSIS — N182 Chronic kidney disease, stage 2 (mild): Secondary | ICD-10-CM | POA: Diagnosis not present

## 2016-11-20 DIAGNOSIS — E784 Other hyperlipidemia: Secondary | ICD-10-CM | POA: Diagnosis not present

## 2016-11-20 DIAGNOSIS — I10 Essential (primary) hypertension: Secondary | ICD-10-CM | POA: Diagnosis not present

## 2016-11-23 DIAGNOSIS — J440 Chronic obstructive pulmonary disease with acute lower respiratory infection: Secondary | ICD-10-CM | POA: Diagnosis not present

## 2016-11-23 DIAGNOSIS — M818 Other osteoporosis without current pathological fracture: Secondary | ICD-10-CM | POA: Diagnosis not present

## 2016-11-23 DIAGNOSIS — I10 Essential (primary) hypertension: Secondary | ICD-10-CM | POA: Diagnosis not present

## 2016-11-23 DIAGNOSIS — E784 Other hyperlipidemia: Secondary | ICD-10-CM | POA: Diagnosis not present

## 2016-11-23 DIAGNOSIS — B028 Zoster with other complications: Secondary | ICD-10-CM | POA: Diagnosis not present

## 2016-12-25 DIAGNOSIS — I10 Essential (primary) hypertension: Secondary | ICD-10-CM | POA: Diagnosis not present

## 2016-12-25 DIAGNOSIS — E7849 Other hyperlipidemia: Secondary | ICD-10-CM | POA: Diagnosis not present

## 2016-12-25 DIAGNOSIS — J441 Chronic obstructive pulmonary disease with (acute) exacerbation: Secondary | ICD-10-CM | POA: Diagnosis not present

## 2016-12-25 DIAGNOSIS — N182 Chronic kidney disease, stage 2 (mild): Secondary | ICD-10-CM | POA: Diagnosis not present

## 2017-02-13 DIAGNOSIS — J441 Chronic obstructive pulmonary disease with (acute) exacerbation: Secondary | ICD-10-CM | POA: Diagnosis not present

## 2017-02-13 DIAGNOSIS — E7849 Other hyperlipidemia: Secondary | ICD-10-CM | POA: Diagnosis not present

## 2017-02-13 DIAGNOSIS — I10 Essential (primary) hypertension: Secondary | ICD-10-CM | POA: Diagnosis not present

## 2017-02-13 DIAGNOSIS — N182 Chronic kidney disease, stage 2 (mild): Secondary | ICD-10-CM | POA: Diagnosis not present

## 2017-02-28 DIAGNOSIS — N182 Chronic kidney disease, stage 2 (mild): Secondary | ICD-10-CM | POA: Diagnosis not present

## 2017-02-28 DIAGNOSIS — I10 Essential (primary) hypertension: Secondary | ICD-10-CM | POA: Diagnosis not present

## 2017-02-28 DIAGNOSIS — E7849 Other hyperlipidemia: Secondary | ICD-10-CM | POA: Diagnosis not present

## 2017-03-05 DIAGNOSIS — M818 Other osteoporosis without current pathological fracture: Secondary | ICD-10-CM | POA: Diagnosis not present

## 2017-03-05 DIAGNOSIS — J441 Chronic obstructive pulmonary disease with (acute) exacerbation: Secondary | ICD-10-CM | POA: Diagnosis not present

## 2017-03-05 DIAGNOSIS — I10 Essential (primary) hypertension: Secondary | ICD-10-CM | POA: Diagnosis not present

## 2017-03-05 DIAGNOSIS — E7849 Other hyperlipidemia: Secondary | ICD-10-CM | POA: Diagnosis not present

## 2017-03-05 DIAGNOSIS — B028 Zoster with other complications: Secondary | ICD-10-CM | POA: Diagnosis not present

## 2017-03-05 DIAGNOSIS — N182 Chronic kidney disease, stage 2 (mild): Secondary | ICD-10-CM | POA: Diagnosis not present

## 2017-04-01 DIAGNOSIS — E7849 Other hyperlipidemia: Secondary | ICD-10-CM | POA: Diagnosis not present

## 2017-04-01 DIAGNOSIS — J441 Chronic obstructive pulmonary disease with (acute) exacerbation: Secondary | ICD-10-CM | POA: Diagnosis not present

## 2017-04-01 DIAGNOSIS — I10 Essential (primary) hypertension: Secondary | ICD-10-CM | POA: Diagnosis not present

## 2017-04-24 DIAGNOSIS — H26492 Other secondary cataract, left eye: Secondary | ICD-10-CM | POA: Diagnosis not present

## 2017-04-24 DIAGNOSIS — Z961 Presence of intraocular lens: Secondary | ICD-10-CM | POA: Diagnosis not present

## 2017-04-24 DIAGNOSIS — H5203 Hypermetropia, bilateral: Secondary | ICD-10-CM | POA: Diagnosis not present

## 2017-04-24 DIAGNOSIS — H2511 Age-related nuclear cataract, right eye: Secondary | ICD-10-CM | POA: Diagnosis not present

## 2017-04-26 DIAGNOSIS — E7849 Other hyperlipidemia: Secondary | ICD-10-CM | POA: Diagnosis not present

## 2017-04-26 DIAGNOSIS — I10 Essential (primary) hypertension: Secondary | ICD-10-CM | POA: Diagnosis not present

## 2017-04-26 DIAGNOSIS — J441 Chronic obstructive pulmonary disease with (acute) exacerbation: Secondary | ICD-10-CM | POA: Diagnosis not present

## 2017-05-31 DIAGNOSIS — J441 Chronic obstructive pulmonary disease with (acute) exacerbation: Secondary | ICD-10-CM | POA: Diagnosis not present

## 2017-05-31 DIAGNOSIS — I10 Essential (primary) hypertension: Secondary | ICD-10-CM | POA: Diagnosis not present

## 2017-05-31 DIAGNOSIS — E7849 Other hyperlipidemia: Secondary | ICD-10-CM | POA: Diagnosis not present

## 2017-06-03 DIAGNOSIS — N182 Chronic kidney disease, stage 2 (mild): Secondary | ICD-10-CM | POA: Diagnosis not present

## 2017-06-03 DIAGNOSIS — Z0000 Encounter for general adult medical examination without abnormal findings: Secondary | ICD-10-CM | POA: Diagnosis not present

## 2017-06-03 DIAGNOSIS — Z1389 Encounter for screening for other disorder: Secondary | ICD-10-CM | POA: Diagnosis not present

## 2017-06-03 DIAGNOSIS — J441 Chronic obstructive pulmonary disease with (acute) exacerbation: Secondary | ICD-10-CM | POA: Diagnosis not present

## 2017-06-03 DIAGNOSIS — M818 Other osteoporosis without current pathological fracture: Secondary | ICD-10-CM | POA: Diagnosis not present

## 2017-06-03 DIAGNOSIS — E7849 Other hyperlipidemia: Secondary | ICD-10-CM | POA: Diagnosis not present

## 2017-06-03 DIAGNOSIS — I10 Essential (primary) hypertension: Secondary | ICD-10-CM | POA: Diagnosis not present

## 2017-07-03 DIAGNOSIS — H25811 Combined forms of age-related cataract, right eye: Secondary | ICD-10-CM | POA: Diagnosis not present

## 2017-07-03 DIAGNOSIS — H26492 Other secondary cataract, left eye: Secondary | ICD-10-CM | POA: Diagnosis not present

## 2017-08-06 NOTE — Patient Instructions (Signed)
Your procedure is scheduled on: 08/16/2017  Report to [facility] at  2  AM.  Call this number if you have problems the morning of surgery: [phone]   Do not eat food or drink liquids :After Midnight.      Take these medicines the morning of surgery with A SIP OF WATER: amlodipine, cymbalta, neurontin.   Do not wear jewelry, make-up or nail polish.  Do not wear lotions, powders, or perfumes. You may wear deodorant.  Do not shave 48 hours prior to surgery.  Do not bring valuables to the hospital.  Contacts, dentures or bridgework may not be worn into surgery.  Leave suitcase in the car. After surgery it may be brought to your room.  For patients admitted to the hospital, checkout time is 11:00 AM the day of discharge.   Patients discharged the day of surgery will not be allowed to drive home.  :     Please read over the following fact sheets that you were given: Coughing and Deep Breathing, Surgical Site Infection Prevention, Anesthesia Post-op Instructions and Care and Recovery After Surgery    Cataract A cataract is a clouding of the lens of the eye. When a lens becomes cloudy, vision is reduced based on the degree and nature of the clouding. Many cataracts reduce vision to some degree. Some cataracts make people more near-sighted as they develop. Other cataracts increase glare. Cataracts that are ignored and become worse can sometimes look white. The white color can be seen through the pupil. CAUSES   Aging. However, cataracts may occur at any age, even in newborns.   Certain drugs.   Trauma to the eye.   Certain diseases such as diabetes.   Specific eye diseases such as chronic inflammation inside the eye or a sudden attack of a rare form of glaucoma.   Inherited or acquired medical problems.  SYMPTOMS   Gradual, progressive drop in vision in the affected eye.   Severe, rapid visual loss. This most often happens when trauma is the cause.  DIAGNOSIS  To detect a  cataract, an eye doctor examines the lens. Cataracts are best diagnosed with an exam of the eyes with the pupils enlarged (dilated) by drops.  TREATMENT  For an early cataract, vision may improve by using different eyeglasses or stronger lighting. If that does not help your vision, surgery is the only effective treatment. A cataract needs to be surgically removed when vision loss interferes with your everyday activities, such as driving, reading, or watching TV. A cataract may also have to be removed if it prevents examination or treatment of another eye problem. Surgery removes the cloudy lens and usually replaces it with a substitute lens (intraocular lens, IOL).  At a time when both you and your doctor agree, the cataract will be surgically removed. If you have cataracts in both eyes, only one is usually removed at a time. This allows the operated eye to heal and be out of danger from any possible problems after surgery (such as infection or poor wound healing). In rare cases, a cataract may be doing damage to your eye. In these cases, your caregiver may advise surgical removal right away. The vast majority of people who have cataract surgery have better vision afterward. HOME CARE INSTRUCTIONS  If you are not planning surgery, you may be asked to do the following:  Use different eyeglasses.   Use stronger or brighter lighting.   Ask your eye doctor about reducing your medicine  dose or changing medicines if it is thought that a medicine caused your cataract. Changing medicines does not make the cataract go away on its own.   Become familiar with your surroundings. Poor vision can lead to injury. Avoid bumping into things on the affected side. You are at a higher risk for tripping or falling.   Exercise extreme care when driving or operating machinery.   Wear sunglasses if you are sensitive to bright light or experiencing problems with glare.  SEEK IMMEDIATE MEDICAL CARE IF:   You have a  worsening or sudden vision loss.   You notice redness, swelling, or increasing pain in the eye.   You have a fever.  Document Released: 02/12/2005 Document Revised: 02/01/2011 Document Reviewed: 10/06/2010 [facility] Patient Information 2012 [place].PATIENT INSTRUCTIONS POST-ANESTHESIA  IMMEDIATELY FOLLOWING SURGERY:  Do not drive or operate machinery for the first twenty four hours after surgery.  Do not make any important decisions for twenty four hours after surgery or while taking narcotic pain medications or sedatives.  If you develop intractable nausea and vomiting or a severe headache please notify your doctor immediately.  FOLLOW-UP:  Please make an appointment with your surgeon as instructed. You do not need to follow up with anesthesia unless specifically instructed to do so.  WOUND CARE INSTRUCTIONS (if applicable):  Keep a dry clean dressing on the anesthesia/puncture wound site if there is drainage.  Once the wound has quit draining you may leave it open to air.  Generally you should leave the bandage intact for twenty four hours unless there is drainage.  If the epidural site drains for more than 36-48 hours please call the anesthesia department.  QUESTIONS?:  Please feel free to call your physician or the hospital operator if you have any questions, and they will be happy to assist you.

## 2017-08-09 ENCOUNTER — Encounter (HOSPITAL_COMMUNITY): Payer: Self-pay

## 2017-08-09 ENCOUNTER — Other Ambulatory Visit: Payer: Self-pay

## 2017-08-09 ENCOUNTER — Encounter (HOSPITAL_COMMUNITY)
Admission: RE | Admit: 2017-08-09 | Discharge: 2017-08-09 | Disposition: A | Discharge status: Home / Self Care | Payer: Medicare Other | Source: Ambulatory Visit | Attending: Ophthalmology | Admitting: Ophthalmology

## 2017-08-09 DIAGNOSIS — H2511 Age-related nuclear cataract, right eye: Secondary | ICD-10-CM | POA: Insufficient documentation

## 2017-08-09 DIAGNOSIS — Z01818 Encounter for other preprocedural examination: Secondary | ICD-10-CM | POA: Insufficient documentation

## 2017-08-09 DIAGNOSIS — R9431 Abnormal electrocardiogram [ECG] [EKG]: Secondary | ICD-10-CM | POA: Diagnosis not present

## 2017-08-09 DIAGNOSIS — Z01810 Encounter for preprocedural cardiovascular examination: Secondary | ICD-10-CM | POA: Diagnosis not present

## 2017-08-09 DIAGNOSIS — H25811 Combined forms of age-related cataract, right eye: Secondary | ICD-10-CM | POA: Diagnosis not present

## 2017-08-09 HISTORY — DX: Anxiety disorder, unspecified: F41.9

## 2017-08-09 HISTORY — DX: Personal history of other infectious and parasitic diseases: Z86.19

## 2017-08-09 LAB — BASIC METABOLIC PANEL
Anion gap: 7 (ref 5–15)
BUN: 20 mg/dL (ref 6–20)
CALCIUM: 9.2 mg/dL (ref 8.9–10.3)
CO2: 27 mmol/L (ref 22–32)
CREATININE: 1.46 mg/dL — AB (ref 0.44–1.00)
Chloride: 107 mmol/L (ref 101–111)
GFR calc non Af Amer: 32 mL/min — ABNORMAL LOW (ref >60)
GFR, EST AFRICAN AMERICAN: 37 mL/min — AB (ref >60)
Glucose, Bld: 100 mg/dL — ABNORMAL HIGH (ref 65–99)
Potassium: 4.9 mmol/L (ref 3.5–5.1)
Sodium: 141 mmol/L (ref 135–145)

## 2017-08-09 LAB — CBC WITH DIFFERENTIAL/PLATELET
BASOS PCT: 0 %
Basophils Absolute: 0 10*3/uL (ref 0.0–0.1)
EOS ABS: 0.3 10*3/uL (ref 0.0–0.7)
Eosinophils Relative: 4 %
HEMATOCRIT: 32 % — AB (ref 36.0–46.0)
Hemoglobin: 10.3 g/dL — ABNORMAL LOW (ref 12.0–15.0)
Lymphocytes Relative: 27 %
Lymphs Abs: 2.3 10*3/uL (ref 0.7–4.0)
MCH: 31.9 pg (ref 26.0–34.0)
MCHC: 32.2 g/dL (ref 30.0–36.0)
MCV: 99.1 fL (ref 78.0–100.0)
MONO ABS: 0.9 10*3/uL (ref 0.1–1.0)
MONOS PCT: 10 %
NEUTROS ABS: 5.1 10*3/uL (ref 1.7–7.7)
Neutrophils Relative %: 59 %
Platelets: 395 10*3/uL (ref 150–400)
RBC: 3.23 MIL/uL — ABNORMAL LOW (ref 3.87–5.11)
RDW: 13.6 % (ref 11.5–15.5)
WBC: 8.6 10*3/uL (ref 4.0–10.5)

## 2017-08-15 DIAGNOSIS — H2511 Age-related nuclear cataract, right eye: Secondary | ICD-10-CM | POA: Diagnosis not present

## 2017-08-15 MED ORDER — CYCLOPENTOLATE-PHENYLEPHRINE 0.2-1 % OP SOLN
OPHTHALMIC | Status: AC
  Filled 2017-08-15: qty 2

## 2017-08-15 MED ORDER — LIDOCAINE HCL 3.5 % OP GEL
OPHTHALMIC | Status: AC
  Filled 2017-08-15: qty 1

## 2017-08-15 MED ORDER — NEOMYCIN-POLYMYXIN-DEXAMETH 3.5-10000-0.1 OP SUSP
OPHTHALMIC | Status: AC
  Filled 2017-08-15: qty 5

## 2017-08-15 MED ORDER — TETRACAINE HCL 0.5 % OP SOLN
OPHTHALMIC | Status: AC
  Filled 2017-08-15: qty 4

## 2017-08-15 MED ORDER — LIDOCAINE HCL (PF) 1 % IJ SOLN
INTRAMUSCULAR | Status: AC
  Filled 2017-08-15: qty 2

## 2017-08-15 MED ORDER — PHENYLEPHRINE HCL 2.5 % OP SOLN
OPHTHALMIC | Status: AC
  Filled 2017-08-15: qty 15

## 2017-08-16 ENCOUNTER — Ambulatory Visit (HOSPITAL_COMMUNITY)
Admission: RE | Admit: 2017-08-16 | Discharge: 2017-08-16 | Disposition: A | Discharge status: Home / Self Care | Payer: Medicare Other | Source: Ambulatory Visit | Attending: Ophthalmology | Admitting: Ophthalmology

## 2017-08-16 ENCOUNTER — Encounter (HOSPITAL_COMMUNITY)
Admission: RE | Disposition: A | Discharge status: Home / Self Care | Payer: Self-pay | Source: Ambulatory Visit | Attending: Ophthalmology

## 2017-08-16 ENCOUNTER — Ambulatory Visit (HOSPITAL_COMMUNITY): Payer: Medicare Other | Admitting: Anesthesiology

## 2017-08-16 ENCOUNTER — Encounter (HOSPITAL_COMMUNITY): Payer: Self-pay

## 2017-08-16 DIAGNOSIS — H25811 Combined forms of age-related cataract, right eye: Secondary | ICD-10-CM | POA: Diagnosis not present

## 2017-08-16 DIAGNOSIS — Z87891 Personal history of nicotine dependence: Secondary | ICD-10-CM | POA: Insufficient documentation

## 2017-08-16 DIAGNOSIS — Z7982 Long term (current) use of aspirin: Secondary | ICD-10-CM | POA: Diagnosis not present

## 2017-08-16 DIAGNOSIS — I10 Essential (primary) hypertension: Secondary | ICD-10-CM | POA: Diagnosis not present

## 2017-08-16 DIAGNOSIS — Z79899 Other long term (current) drug therapy: Secondary | ICD-10-CM | POA: Diagnosis not present

## 2017-08-16 DIAGNOSIS — E7800 Pure hypercholesterolemia, unspecified: Secondary | ICD-10-CM | POA: Diagnosis not present

## 2017-08-16 DIAGNOSIS — H269 Unspecified cataract: Secondary | ICD-10-CM | POA: Diagnosis not present

## 2017-08-16 DIAGNOSIS — H2511 Age-related nuclear cataract, right eye: Secondary | ICD-10-CM | POA: Diagnosis not present

## 2017-08-16 HISTORY — PX: CATARACT EXTRACTION W/PHACO: SHX586

## 2017-08-16 SURGERY — PHACOEMULSIFICATION, CATARACT, WITH IOL INSERTION
Anesthesia: Monitor Anesthesia Care | Site: Eye | Laterality: Right

## 2017-08-16 MED ORDER — PHENYLEPHRINE HCL 2.5 % OP SOLN
1.0000 [drp] | OPHTHALMIC | Status: AC
  Administered 2017-08-16 (×3): 1 [drp] via OPHTHALMIC

## 2017-08-16 MED ORDER — EPINEPHRINE PF 1 MG/ML IJ SOLN
INTRAOCULAR | Status: DC | PRN
  Administered 2017-08-16: 500 mL

## 2017-08-16 MED ORDER — SODIUM HYALURONATE 23 MG/ML IO SOLN
INTRAOCULAR | Status: DC | PRN
  Administered 2017-08-16: 0.6 mL via INTRAOCULAR

## 2017-08-16 MED ORDER — POVIDONE-IODINE 5 % OP SOLN
OPHTHALMIC | Status: DC | PRN
  Administered 2017-08-16: 1 via OPHTHALMIC

## 2017-08-16 MED ORDER — NEOMYCIN-POLYMYXIN-DEXAMETH 3.5-10000-0.1 OP SUSP
OPHTHALMIC | Status: DC | PRN
  Administered 2017-08-16: 2 [drp] via OPHTHALMIC

## 2017-08-16 MED ORDER — CYCLOPENTOLATE-PHENYLEPHRINE 0.2-1 % OP SOLN
1.0000 [drp] | OPHTHALMIC | Status: AC
  Administered 2017-08-16 (×3): 1 [drp] via OPHTHALMIC

## 2017-08-16 MED ORDER — TETRACAINE HCL 0.5 % OP SOLN
1.0000 [drp] | OPHTHALMIC | Status: AC
  Administered 2017-08-16 (×3): 1 [drp] via OPHTHALMIC

## 2017-08-16 MED ORDER — LIDOCAINE HCL 3.5 % OP GEL
1.0000 "application " | Freq: Once | OPHTHALMIC | Status: AC
  Administered 2017-08-16: 1 via OPHTHALMIC

## 2017-08-16 MED ORDER — LIDOCAINE HCL (PF) 1 % IJ SOLN
INTRAOCULAR | Status: DC | PRN
  Administered 2017-08-16: .9 mL

## 2017-08-16 MED ORDER — BSS IO SOLN
INTRAOCULAR | Status: DC | PRN
  Administered 2017-08-16: 15 mL

## 2017-08-16 MED ORDER — PROVISC 10 MG/ML IO SOLN
INTRAOCULAR | Status: DC | PRN
  Administered 2017-08-16: 0.85 mL via INTRAOCULAR

## 2017-08-16 MED ORDER — LACTATED RINGERS IV SOLN
INTRAVENOUS | Status: DC
  Administered 2017-08-16: 07:00:00 via INTRAVENOUS

## 2017-08-16 SURGICAL SUPPLY — 15 items
CLOTH BEACON ORANGE TIMEOUT ST (SAFETY) ×1 IMPLANT
EYE SHIELD UNIVERSAL CLEAR (GAUZE/BANDAGES/DRESSINGS) ×1 IMPLANT
GLOVE BIOGEL PI IND STRL 6.5 (GLOVE) IMPLANT
GLOVE BIOGEL PI IND STRL 7.0 (GLOVE) IMPLANT
GLOVE BIOGEL PI INDICATOR 6.5 (GLOVE) ×1
GLOVE BIOGEL PI INDICATOR 7.0 (GLOVE) ×1
LENS ALC ACRYL/TECN (Ophthalmic Related) ×1 IMPLANT
NDL HYPO 18GX1.5 BLUNT FILL (NEEDLE) IMPLANT
NEEDLE HYPO 18GX1.5 BLUNT FILL (NEEDLE) ×2 IMPLANT
PAD ARMBOARD 7.5X6 YLW CONV (MISCELLANEOUS) ×1 IMPLANT
SYR TB 1ML LL NO SAFETY (SYRINGE) ×1 IMPLANT
TAPE SURG TRANSPORE 1 IN (GAUZE/BANDAGES/DRESSINGS) IMPLANT
TAPE SURGICAL TRANSPORE 1 IN (GAUZE/BANDAGES/DRESSINGS) ×1
VISCOELASTIC ADDITIONAL (OPHTHALMIC RELATED) ×1 IMPLANT
WATER STERILE IRR 250ML POUR (IV SOLUTION) ×1 IMPLANT

## 2017-08-16 NOTE — Anesthesia Preprocedure Evaluation (Addendum)
Anesthesia Evaluation  Patient identified by MRN, date of birth, ID band Patient awake    Reviewed: Allergy & Precautions, NPO status , Patient's Chart, lab work & pertinent test results  Airway Mallampati: I  TM Distance: >3 FB Neck ROM: Full    Dental no notable dental hx. (+) Edentulous Upper, Edentulous Lower   Pulmonary neg pulmonary ROS, former smoker,  States uses inhalers Prn -last about 7 days ago  Slight wheeze -offered inhaler if desires -didn't want any currently Pulmonary exam normal breath sounds clear to auscultation + wheezing      Cardiovascular Exercise Tolerance: Poor hypertension, negative cardio ROS Normal cardiovascular examII Rhythm:Regular Rate:Normal     Neuro/Psych Anxiety negative neurological ROS  negative psych ROS   GI/Hepatic negative GI ROS, Neg liver ROS,   Endo/Other  negative endocrine ROS  Renal/GU negative Renal ROS  negative genitourinary   Musculoskeletal negative musculoskeletal ROS (+)   Abdominal   Peds negative pediatric ROS (+)  Hematology negative hematology ROS (+)   Anesthesia Other Findings   Reproductive/Obstetrics negative OB ROS                             Anesthesia Physical Anesthesia Plan  ASA: II  Anesthesia Plan: MAC   Post-op Pain Management:    Induction: Intravenous  PONV Risk Score and Plan:   Airway Management Planned: Nasal Cannula  Additional Equipment:   Intra-op Plan:   Post-operative Plan:   Informed Consent: I have reviewed the patients History and Physical, chart, labs and discussed the procedure including the risks, benefits and alternatives for the proposed anesthesia with the patient or authorized representative who has indicated his/her understanding and acceptance.     Plan Discussed with: CRNA  Anesthesia Plan Comments:        Anesthesia Quick Evaluation

## 2017-08-16 NOTE — Op Note (Addendum)
Date of procedure: 08/16/17  Pre-operative diagnosis: Visually significant cataract, Right Eye (H25.811)  Post-operative diagnosis: Visually significant cataract, Right Eye  Procedure: Removal of cataract via phacoemulsification and insertion of intra-ocular lens [employer] PCB00  +22.5D into the capsular bag of the Right Eye  Attending surgeon: Jade Burright A. Shamon Cothran, MD, MA  Anesthesia: MAC, Topical Akten  Complications: None  Estimated Blood Loss: <1mL (minimal)  Specimens: None  Implants: As above  Indications:  Visually significant cataract, Right Eye  Procedure:  The patient was seen and identified in the pre-operative area. The operative eye was identified and dilated.  The operative eye was marked.  Topical anesthesia was administered to the operative eye.     The patient was then to the operative suite and placed in the supine position.  A timeout was performed confirming the patient, procedure to be performed, and all other relevant information.   The patient's face was prepped and draped in the usual fashion for intra-ocular surgery.  A lid speculum was placed into the operative eye and the surgical microscope moved into place and focused.  A superotemporal paracentesis was created using a 20 gauge paracentesis blade.  Shugarcaine was injected into the anterior chamber.  Viscoelastic was injected into the anterior chamber.  A temporal clear-corneal main wound incision was created using a 2.4mm microkeratome.  A continuous curvilinear capsulorrhexis was initiated using an irrigating cystitome and completed using capsulorrhexis forceps.  Hydrodissection and hydrodeliniation were performed.  Viscoelastic was injected into the anterior chamber.  A phacoemulsification handpiece and a chopper as a second instrument were used to remove the nucleus and epinucleus. The irrigation/aspiration handpiece was used to remove any remaining cortical material.   The capsular bag was  reinflated with viscoelastic, checked, and found to be intact.  The intraocular lens was inserted into the capsular bag and dialed into place using a Kuglen hook.  The irrigation/aspiration handpiece was used to remove any remaining viscoelastic.  The clear corneal wound and paracentesis wounds were then hydrated and checked with Weck-Cels to be watertight.  The lid-speculum and drape was removed, and the patient's face was cleaned with a wet and dry 4x4.  Maxitrol was instilled in the eye before a clear shield was taped over the eye. The patient was taken to the post-operative care unit in good condition, having tolerated the procedure well.  Post-Op Instructions: The patient will follow up at [facility] for a same day post-operative evaluation and will receive all other orders and instructions. 

## 2017-08-16 NOTE — Anesthesia Procedure Notes (Signed)
Procedure Name: MAC Date/Time: 08/16/2017 7:25 AM Performed by: Vista Deck, CRNA Pre-anesthesia Checklist: Patient identified, Emergency Drugs available, Suction available, Timeout performed and Patient being monitored Patient Re-evaluated:Patient Re-evaluated prior to induction Oxygen Delivery Method: Nasal Cannula

## 2017-08-16 NOTE — Anesthesia Postprocedure Evaluation (Signed)
Anesthesia Post Note  Patient: Alison Roberson  Procedure(s) Performed: CATARACT EXTRACTION PHACO AND INTRAOCULAR LENS PLACEMENT (IOC) (Right Eye)  Patient location during evaluation: Short Stay Anesthesia Type: MAC Level of consciousness: awake and alert and patient cooperative Pain management: satisfactory to patient Vital Signs Assessment: post-procedure vital signs reviewed and stable Respiratory status: spontaneous breathing Cardiovascular status: stable Postop Assessment: no apparent nausea or vomiting Anesthetic complications: no     Last Vitals:  Vitals:   08/16/17 0632 08/16/17 0758  BP: (!) 153/69 (!) 147/65  Pulse: 93 90  Resp:  16  Temp: 36.7 C 36.6 C  SpO2: 94% 97%    Last Pain:  Vitals:   08/16/17 0758  TempSrc: Oral  PainSc: 0-No pain                 Arlon Bleier

## 2017-08-16 NOTE — Discharge Instructions (Signed)
Please discharge patient when stable, will follow up today with Dr. Ivyonna Hoelzel at the [facility] office immediately following discharge.  Leave shield in place until visit.  All paperwork with discharge instructions will be given at the office. ° °

## 2017-08-16 NOTE — Addendum Note (Signed)
Addendum  created 08/16/17 1123 by Vista Deck, CRNA   SmartForm saved

## 2017-08-16 NOTE — Transfer of Care (Signed)
Immediate Anesthesia Transfer of Care Note  Patient: Alison Roberson  Procedure(s) Performed: CATARACT EXTRACTION PHACO AND INTRAOCULAR LENS PLACEMENT (IOC) (Right Eye)  Patient Location: Short Stay  Anesthesia Type:MAC  Level of Consciousness: awake, alert  and patient cooperative  Airway & Oxygen Therapy: Patient Spontanous Breathing  Post-op Assessment: Report given to RN and Post -op Vital signs reviewed and stable  Post vital signs: Reviewed and stable  Last Vitals:  Vitals Value Taken Time  BP    Temp    Pulse    Resp    SpO2      Last Pain:  Vitals:   08/16/17 2229  TempSrc: Oral  PainSc: 0-No pain         Complications: No apparent anesthesia complications

## 2017-08-16 NOTE — H&P (Signed)
The H and P was reviewed and updated. The patient was examined.  No changes were found after exam.  The surgical eye was marked.  

## 2017-08-16 NOTE — Addendum Note (Signed)
Addendum  created 08/16/17 1046 by Vista Deck, CRNA   Sign clinical note

## 2017-08-19 ENCOUNTER — Encounter (HOSPITAL_COMMUNITY): Payer: Self-pay | Admitting: Ophthalmology

## 2017-08-26 DIAGNOSIS — N182 Chronic kidney disease, stage 2 (mild): Secondary | ICD-10-CM | POA: Diagnosis not present

## 2017-08-26 DIAGNOSIS — J441 Chronic obstructive pulmonary disease with (acute) exacerbation: Secondary | ICD-10-CM | POA: Diagnosis not present

## 2017-08-26 DIAGNOSIS — I10 Essential (primary) hypertension: Secondary | ICD-10-CM | POA: Diagnosis not present

## 2017-08-26 DIAGNOSIS — E7849 Other hyperlipidemia: Secondary | ICD-10-CM | POA: Diagnosis not present

## 2017-09-02 DIAGNOSIS — N182 Chronic kidney disease, stage 2 (mild): Secondary | ICD-10-CM | POA: Diagnosis not present

## 2017-09-02 DIAGNOSIS — I10 Essential (primary) hypertension: Secondary | ICD-10-CM | POA: Diagnosis not present

## 2017-09-02 DIAGNOSIS — M818 Other osteoporosis without current pathological fracture: Secondary | ICD-10-CM | POA: Diagnosis not present

## 2017-09-02 DIAGNOSIS — Z0000 Encounter for general adult medical examination without abnormal findings: Secondary | ICD-10-CM | POA: Diagnosis not present

## 2017-09-02 DIAGNOSIS — E7849 Other hyperlipidemia: Secondary | ICD-10-CM | POA: Diagnosis not present

## 2017-09-02 DIAGNOSIS — J441 Chronic obstructive pulmonary disease with (acute) exacerbation: Secondary | ICD-10-CM | POA: Diagnosis not present

## 2017-09-26 DIAGNOSIS — E7849 Other hyperlipidemia: Secondary | ICD-10-CM | POA: Diagnosis not present

## 2017-09-26 DIAGNOSIS — J441 Chronic obstructive pulmonary disease with (acute) exacerbation: Secondary | ICD-10-CM | POA: Diagnosis not present

## 2017-09-26 DIAGNOSIS — I10 Essential (primary) hypertension: Secondary | ICD-10-CM | POA: Diagnosis not present

## 2017-09-26 DIAGNOSIS — M818 Other osteoporosis without current pathological fracture: Secondary | ICD-10-CM | POA: Diagnosis not present

## 2017-09-26 DIAGNOSIS — N182 Chronic kidney disease, stage 2 (mild): Secondary | ICD-10-CM | POA: Diagnosis not present

## 2017-10-03 DIAGNOSIS — Z961 Presence of intraocular lens: Secondary | ICD-10-CM | POA: Diagnosis not present

## 2017-11-19 DIAGNOSIS — J441 Chronic obstructive pulmonary disease with (acute) exacerbation: Secondary | ICD-10-CM | POA: Diagnosis not present

## 2017-11-19 DIAGNOSIS — E7849 Other hyperlipidemia: Secondary | ICD-10-CM | POA: Diagnosis not present

## 2017-11-19 DIAGNOSIS — N182 Chronic kidney disease, stage 2 (mild): Secondary | ICD-10-CM | POA: Diagnosis not present

## 2017-11-19 DIAGNOSIS — I10 Essential (primary) hypertension: Secondary | ICD-10-CM | POA: Diagnosis not present

## 2017-11-19 DIAGNOSIS — M818 Other osteoporosis without current pathological fracture: Secondary | ICD-10-CM | POA: Diagnosis not present

## 2017-12-03 DIAGNOSIS — N182 Chronic kidney disease, stage 2 (mild): Secondary | ICD-10-CM | POA: Diagnosis not present

## 2017-12-03 DIAGNOSIS — M818 Other osteoporosis without current pathological fracture: Secondary | ICD-10-CM | POA: Diagnosis not present

## 2017-12-03 DIAGNOSIS — I10 Essential (primary) hypertension: Secondary | ICD-10-CM | POA: Diagnosis not present

## 2017-12-03 DIAGNOSIS — J449 Chronic obstructive pulmonary disease, unspecified: Secondary | ICD-10-CM | POA: Diagnosis not present

## 2017-12-03 DIAGNOSIS — E7849 Other hyperlipidemia: Secondary | ICD-10-CM | POA: Diagnosis not present

## 2017-12-24 DIAGNOSIS — M818 Other osteoporosis without current pathological fracture: Secondary | ICD-10-CM | POA: Diagnosis not present

## 2017-12-24 DIAGNOSIS — I10 Essential (primary) hypertension: Secondary | ICD-10-CM | POA: Diagnosis not present

## 2017-12-24 DIAGNOSIS — N182 Chronic kidney disease, stage 2 (mild): Secondary | ICD-10-CM | POA: Diagnosis not present

## 2017-12-24 DIAGNOSIS — E7849 Other hyperlipidemia: Secondary | ICD-10-CM | POA: Diagnosis not present

## 2017-12-24 DIAGNOSIS — J449 Chronic obstructive pulmonary disease, unspecified: Secondary | ICD-10-CM | POA: Diagnosis not present

## 2018-01-21 DIAGNOSIS — M818 Other osteoporosis without current pathological fracture: Secondary | ICD-10-CM | POA: Diagnosis not present

## 2018-01-21 DIAGNOSIS — E7849 Other hyperlipidemia: Secondary | ICD-10-CM | POA: Diagnosis not present

## 2018-01-21 DIAGNOSIS — I10 Essential (primary) hypertension: Secondary | ICD-10-CM | POA: Diagnosis not present

## 2018-01-21 DIAGNOSIS — J449 Chronic obstructive pulmonary disease, unspecified: Secondary | ICD-10-CM | POA: Diagnosis not present

## 2018-01-21 DIAGNOSIS — N182 Chronic kidney disease, stage 2 (mild): Secondary | ICD-10-CM | POA: Diagnosis not present

## 2018-02-20 DIAGNOSIS — E7849 Other hyperlipidemia: Secondary | ICD-10-CM | POA: Diagnosis not present

## 2018-02-20 DIAGNOSIS — J449 Chronic obstructive pulmonary disease, unspecified: Secondary | ICD-10-CM | POA: Diagnosis not present

## 2018-02-20 DIAGNOSIS — I10 Essential (primary) hypertension: Secondary | ICD-10-CM | POA: Diagnosis not present

## 2018-02-20 DIAGNOSIS — N182 Chronic kidney disease, stage 2 (mild): Secondary | ICD-10-CM | POA: Diagnosis not present

## 2018-02-20 DIAGNOSIS — M818 Other osteoporosis without current pathological fracture: Secondary | ICD-10-CM | POA: Diagnosis not present

## 2018-03-10 DIAGNOSIS — J449 Chronic obstructive pulmonary disease, unspecified: Secondary | ICD-10-CM | POA: Diagnosis not present

## 2018-03-10 DIAGNOSIS — I10 Essential (primary) hypertension: Secondary | ICD-10-CM | POA: Diagnosis not present

## 2018-03-10 DIAGNOSIS — N182 Chronic kidney disease, stage 2 (mild): Secondary | ICD-10-CM | POA: Diagnosis not present

## 2018-03-10 DIAGNOSIS — Z681 Body mass index (BMI) 19 or less, adult: Secondary | ICD-10-CM | POA: Diagnosis not present

## 2018-03-10 DIAGNOSIS — M818 Other osteoporosis without current pathological fracture: Secondary | ICD-10-CM | POA: Diagnosis not present

## 2018-03-10 DIAGNOSIS — Z6820 Body mass index (BMI) 20.0-20.9, adult: Secondary | ICD-10-CM | POA: Diagnosis not present

## 2018-03-10 DIAGNOSIS — E7849 Other hyperlipidemia: Secondary | ICD-10-CM | POA: Diagnosis not present

## 2018-03-20 DIAGNOSIS — J449 Chronic obstructive pulmonary disease, unspecified: Secondary | ICD-10-CM | POA: Diagnosis not present

## 2018-03-20 DIAGNOSIS — N182 Chronic kidney disease, stage 2 (mild): Secondary | ICD-10-CM | POA: Diagnosis not present

## 2018-03-20 DIAGNOSIS — I10 Essential (primary) hypertension: Secondary | ICD-10-CM | POA: Diagnosis not present

## 2018-03-20 DIAGNOSIS — E7849 Other hyperlipidemia: Secondary | ICD-10-CM | POA: Diagnosis not present

## 2018-04-11 DIAGNOSIS — M25531 Pain in right wrist: Secondary | ICD-10-CM | POA: Diagnosis not present

## 2018-04-11 DIAGNOSIS — M545 Low back pain: Secondary | ICD-10-CM | POA: Diagnosis not present

## 2018-04-11 DIAGNOSIS — M25551 Pain in right hip: Secondary | ICD-10-CM | POA: Diagnosis not present

## 2018-04-11 DIAGNOSIS — M19041 Primary osteoarthritis, right hand: Secondary | ICD-10-CM | POA: Diagnosis not present

## 2018-04-11 DIAGNOSIS — M1611 Unilateral primary osteoarthritis, right hip: Secondary | ICD-10-CM | POA: Diagnosis not present

## 2018-04-11 DIAGNOSIS — M47817 Spondylosis without myelopathy or radiculopathy, lumbosacral region: Secondary | ICD-10-CM | POA: Diagnosis not present

## 2018-04-17 DIAGNOSIS — I10 Essential (primary) hypertension: Secondary | ICD-10-CM | POA: Diagnosis not present

## 2018-04-17 DIAGNOSIS — E7849 Other hyperlipidemia: Secondary | ICD-10-CM | POA: Diagnosis not present

## 2018-04-17 DIAGNOSIS — J449 Chronic obstructive pulmonary disease, unspecified: Secondary | ICD-10-CM | POA: Diagnosis not present

## 2018-04-17 DIAGNOSIS — N182 Chronic kidney disease, stage 2 (mild): Secondary | ICD-10-CM | POA: Diagnosis not present

## 2018-04-29 DIAGNOSIS — G629 Polyneuropathy, unspecified: Secondary | ICD-10-CM | POA: Diagnosis not present

## 2018-04-29 DIAGNOSIS — Z8249 Family history of ischemic heart disease and other diseases of the circulatory system: Secondary | ICD-10-CM | POA: Diagnosis not present

## 2018-04-29 DIAGNOSIS — G8929 Other chronic pain: Secondary | ICD-10-CM | POA: Diagnosis not present

## 2018-04-29 DIAGNOSIS — B0229 Other postherpetic nervous system involvement: Secondary | ICD-10-CM | POA: Diagnosis not present

## 2018-04-29 DIAGNOSIS — J45909 Unspecified asthma, uncomplicated: Secondary | ICD-10-CM | POA: Diagnosis not present

## 2018-04-29 DIAGNOSIS — E785 Hyperlipidemia, unspecified: Secondary | ICD-10-CM | POA: Diagnosis not present

## 2018-04-29 DIAGNOSIS — R69 Illness, unspecified: Secondary | ICD-10-CM | POA: Diagnosis not present

## 2018-04-29 DIAGNOSIS — I10 Essential (primary) hypertension: Secondary | ICD-10-CM | POA: Diagnosis not present

## 2018-04-29 DIAGNOSIS — Z87891 Personal history of nicotine dependence: Secondary | ICD-10-CM | POA: Diagnosis not present

## 2018-05-13 DIAGNOSIS — E7849 Other hyperlipidemia: Secondary | ICD-10-CM | POA: Diagnosis not present

## 2018-05-13 DIAGNOSIS — I10 Essential (primary) hypertension: Secondary | ICD-10-CM | POA: Diagnosis not present

## 2018-05-13 DIAGNOSIS — N182 Chronic kidney disease, stage 2 (mild): Secondary | ICD-10-CM | POA: Diagnosis not present

## 2018-05-13 DIAGNOSIS — J449 Chronic obstructive pulmonary disease, unspecified: Secondary | ICD-10-CM | POA: Diagnosis not present

## 2018-06-05 DIAGNOSIS — E7849 Other hyperlipidemia: Secondary | ICD-10-CM | POA: Diagnosis not present

## 2018-06-05 DIAGNOSIS — J449 Chronic obstructive pulmonary disease, unspecified: Secondary | ICD-10-CM | POA: Diagnosis not present

## 2018-06-05 DIAGNOSIS — I10 Essential (primary) hypertension: Secondary | ICD-10-CM | POA: Diagnosis not present

## 2018-06-05 DIAGNOSIS — N182 Chronic kidney disease, stage 2 (mild): Secondary | ICD-10-CM | POA: Diagnosis not present

## 2018-07-02 DIAGNOSIS — N182 Chronic kidney disease, stage 2 (mild): Secondary | ICD-10-CM | POA: Diagnosis not present

## 2018-07-02 DIAGNOSIS — I10 Essential (primary) hypertension: Secondary | ICD-10-CM | POA: Diagnosis not present

## 2018-07-02 DIAGNOSIS — E7849 Other hyperlipidemia: Secondary | ICD-10-CM | POA: Diagnosis not present

## 2018-07-02 DIAGNOSIS — J449 Chronic obstructive pulmonary disease, unspecified: Secondary | ICD-10-CM | POA: Diagnosis not present

## 2018-08-04 DIAGNOSIS — D649 Anemia, unspecified: Secondary | ICD-10-CM | POA: Diagnosis not present

## 2018-08-04 DIAGNOSIS — E875 Hyperkalemia: Secondary | ICD-10-CM | POA: Diagnosis not present

## 2018-08-04 DIAGNOSIS — E440 Moderate protein-calorie malnutrition: Secondary | ICD-10-CM | POA: Diagnosis not present

## 2018-08-04 DIAGNOSIS — N179 Acute kidney failure, unspecified: Secondary | ICD-10-CM | POA: Diagnosis not present

## 2018-08-04 DIAGNOSIS — R531 Weakness: Secondary | ICD-10-CM | POA: Diagnosis not present

## 2018-08-04 DIAGNOSIS — R42 Dizziness and giddiness: Secondary | ICD-10-CM | POA: Diagnosis not present

## 2018-08-04 DIAGNOSIS — M549 Dorsalgia, unspecified: Secondary | ICD-10-CM | POA: Diagnosis not present

## 2018-08-04 DIAGNOSIS — I700 Atherosclerosis of aorta: Secondary | ICD-10-CM | POA: Diagnosis not present

## 2018-08-04 DIAGNOSIS — Z681 Body mass index (BMI) 19 or less, adult: Secondary | ICD-10-CM | POA: Diagnosis not present

## 2018-08-04 DIAGNOSIS — R634 Abnormal weight loss: Secondary | ICD-10-CM | POA: Diagnosis not present

## 2018-08-04 DIAGNOSIS — N289 Disorder of kidney and ureter, unspecified: Secondary | ICD-10-CM | POA: Diagnosis not present

## 2018-08-04 DIAGNOSIS — I110 Hypertensive heart disease with heart failure: Secondary | ICD-10-CM | POA: Diagnosis not present

## 2018-08-04 DIAGNOSIS — I5031 Acute diastolic (congestive) heart failure: Secondary | ICD-10-CM | POA: Diagnosis not present

## 2018-08-04 DIAGNOSIS — E039 Hypothyroidism, unspecified: Secondary | ICD-10-CM | POA: Diagnosis not present

## 2018-08-05 DIAGNOSIS — I517 Cardiomegaly: Secondary | ICD-10-CM | POA: Diagnosis not present

## 2018-08-05 DIAGNOSIS — I700 Atherosclerosis of aorta: Secondary | ICD-10-CM | POA: Diagnosis not present

## 2018-08-05 DIAGNOSIS — N289 Disorder of kidney and ureter, unspecified: Secondary | ICD-10-CM | POA: Diagnosis not present

## 2018-08-05 DIAGNOSIS — I110 Hypertensive heart disease with heart failure: Secondary | ICD-10-CM | POA: Diagnosis not present

## 2018-08-05 DIAGNOSIS — E440 Moderate protein-calorie malnutrition: Secondary | ICD-10-CM | POA: Diagnosis not present

## 2018-08-05 DIAGNOSIS — I352 Nonrheumatic aortic (valve) stenosis with insufficiency: Secondary | ICD-10-CM | POA: Diagnosis not present

## 2018-08-05 DIAGNOSIS — D649 Anemia, unspecified: Secondary | ICD-10-CM | POA: Diagnosis not present

## 2018-08-05 DIAGNOSIS — Z681 Body mass index (BMI) 19 or less, adult: Secondary | ICD-10-CM | POA: Diagnosis not present

## 2018-08-05 DIAGNOSIS — I361 Nonrheumatic tricuspid (valve) insufficiency: Secondary | ICD-10-CM | POA: Diagnosis not present

## 2018-08-05 DIAGNOSIS — N281 Cyst of kidney, acquired: Secondary | ICD-10-CM | POA: Diagnosis not present

## 2018-08-05 DIAGNOSIS — I5031 Acute diastolic (congestive) heart failure: Secondary | ICD-10-CM | POA: Diagnosis not present

## 2018-08-05 DIAGNOSIS — N179 Acute kidney failure, unspecified: Secondary | ICD-10-CM | POA: Diagnosis not present

## 2018-08-06 DIAGNOSIS — R109 Unspecified abdominal pain: Secondary | ICD-10-CM | POA: Diagnosis not present

## 2018-08-06 DIAGNOSIS — R69 Illness, unspecified: Secondary | ICD-10-CM | POA: Diagnosis not present

## 2018-08-06 DIAGNOSIS — Z681 Body mass index (BMI) 19 or less, adult: Secondary | ICD-10-CM | POA: Diagnosis not present

## 2018-08-06 DIAGNOSIS — I5031 Acute diastolic (congestive) heart failure: Secondary | ICD-10-CM | POA: Diagnosis not present

## 2018-08-06 DIAGNOSIS — I700 Atherosclerosis of aorta: Secondary | ICD-10-CM | POA: Diagnosis not present

## 2018-08-06 DIAGNOSIS — N289 Disorder of kidney and ureter, unspecified: Secondary | ICD-10-CM | POA: Diagnosis not present

## 2018-08-06 DIAGNOSIS — D649 Anemia, unspecified: Secondary | ICD-10-CM | POA: Diagnosis not present

## 2018-08-06 DIAGNOSIS — J449 Chronic obstructive pulmonary disease, unspecified: Secondary | ICD-10-CM | POA: Diagnosis not present

## 2018-08-06 DIAGNOSIS — E440 Moderate protein-calorie malnutrition: Secondary | ICD-10-CM | POA: Diagnosis not present

## 2018-08-06 DIAGNOSIS — E785 Hyperlipidemia, unspecified: Secondary | ICD-10-CM | POA: Diagnosis not present

## 2018-08-06 DIAGNOSIS — I110 Hypertensive heart disease with heart failure: Secondary | ICD-10-CM | POA: Diagnosis not present

## 2018-08-20 DIAGNOSIS — N189 Chronic kidney disease, unspecified: Secondary | ICD-10-CM | POA: Diagnosis not present

## 2018-08-20 DIAGNOSIS — I5032 Chronic diastolic (congestive) heart failure: Secondary | ICD-10-CM | POA: Diagnosis not present

## 2018-08-20 DIAGNOSIS — Z681 Body mass index (BMI) 19 or less, adult: Secondary | ICD-10-CM | POA: Diagnosis not present

## 2018-08-28 DIAGNOSIS — N189 Chronic kidney disease, unspecified: Secondary | ICD-10-CM | POA: Diagnosis not present

## 2018-08-28 DIAGNOSIS — I10 Essential (primary) hypertension: Secondary | ICD-10-CM | POA: Diagnosis not present

## 2018-08-28 DIAGNOSIS — I5032 Chronic diastolic (congestive) heart failure: Secondary | ICD-10-CM | POA: Diagnosis not present

## 2018-10-01 DIAGNOSIS — N189 Chronic kidney disease, unspecified: Secondary | ICD-10-CM | POA: Diagnosis not present

## 2018-10-01 DIAGNOSIS — I10 Essential (primary) hypertension: Secondary | ICD-10-CM | POA: Diagnosis not present

## 2018-10-01 DIAGNOSIS — I5032 Chronic diastolic (congestive) heart failure: Secondary | ICD-10-CM | POA: Diagnosis not present

## 2018-10-24 ENCOUNTER — Encounter (HOSPITAL_COMMUNITY): Payer: Self-pay | Admitting: *Deleted

## 2018-10-24 ENCOUNTER — Emergency Department (HOSPITAL_COMMUNITY)
Admission: EM | Admit: 2018-10-24 | Discharge: 2018-10-24 | Disposition: A | Discharge status: Home / Self Care | Payer: Medicare HMO | Attending: Emergency Medicine | Admitting: Emergency Medicine

## 2018-10-24 ENCOUNTER — Emergency Department (HOSPITAL_COMMUNITY): Payer: Medicare HMO

## 2018-10-24 ENCOUNTER — Other Ambulatory Visit: Payer: Self-pay

## 2018-10-24 DIAGNOSIS — R911 Solitary pulmonary nodule: Secondary | ICD-10-CM | POA: Insufficient documentation

## 2018-10-24 DIAGNOSIS — R1084 Generalized abdominal pain: Secondary | ICD-10-CM | POA: Diagnosis not present

## 2018-10-24 DIAGNOSIS — R109 Unspecified abdominal pain: Secondary | ICD-10-CM | POA: Diagnosis not present

## 2018-10-24 DIAGNOSIS — R634 Abnormal weight loss: Secondary | ICD-10-CM | POA: Diagnosis not present

## 2018-10-24 DIAGNOSIS — Z87891 Personal history of nicotine dependence: Secondary | ICD-10-CM | POA: Diagnosis not present

## 2018-10-24 DIAGNOSIS — D649 Anemia, unspecified: Secondary | ICD-10-CM | POA: Diagnosis not present

## 2018-10-24 LAB — COMPREHENSIVE METABOLIC PANEL
ALT: 9 U/L (ref 0–44)
AST: 12 U/L — ABNORMAL LOW (ref 15–41)
Albumin: 2.7 g/dL — ABNORMAL LOW (ref 3.5–5.0)
Alkaline Phosphatase: 119 U/L (ref 38–126)
Anion gap: 10 (ref 5–15)
BUN: 16 mg/dL (ref 8–23)
CO2: 21 mmol/L — ABNORMAL LOW (ref 22–32)
Calcium: 9.4 mg/dL (ref 8.9–10.3)
Chloride: 110 mmol/L (ref 98–111)
Creatinine, Ser: 1.07 mg/dL — ABNORMAL HIGH (ref 0.44–1.00)
GFR calc Af Amer: 55 mL/min — ABNORMAL LOW (ref >60)
GFR calc non Af Amer: 47 mL/min — ABNORMAL LOW (ref >60)
Glucose, Bld: 107 mg/dL — ABNORMAL HIGH (ref 70–99)
Potassium: 4.9 mmol/L (ref 3.5–5.1)
Sodium: 141 mmol/L (ref 135–145)
Total Bilirubin: 0.4 mg/dL (ref 0.3–1.2)
Total Protein: 7.3 g/dL (ref 6.5–8.1)

## 2018-10-24 LAB — POC OCCULT BLOOD, ED: Fecal Occult Bld: NEGATIVE

## 2018-10-24 LAB — URINALYSIS, ROUTINE W REFLEX MICROSCOPIC
Bacteria, UA: NONE SEEN
Bilirubin Urine: NEGATIVE
Glucose, UA: NEGATIVE mg/dL
Hgb urine dipstick: NEGATIVE
Ketones, ur: NEGATIVE mg/dL
Leukocytes,Ua: NEGATIVE
Nitrite: NEGATIVE
Protein, ur: 30 mg/dL — AB
Specific Gravity, Urine: 1.026 (ref 1.005–1.030)
pH: 5 (ref 5.0–8.0)

## 2018-10-24 LAB — CBC WITH DIFFERENTIAL/PLATELET
Abs Immature Granulocytes: 0.06 10*3/uL (ref 0.00–0.07)
Basophils Absolute: 0 10*3/uL (ref 0.0–0.1)
Basophils Relative: 0 %
Eosinophils Absolute: 0 10*3/uL (ref 0.0–0.5)
Eosinophils Relative: 0 %
HCT: 25.3 % — ABNORMAL LOW (ref 36.0–46.0)
Hemoglobin: 7.5 g/dL — ABNORMAL LOW (ref 12.0–15.0)
Immature Granulocytes: 1 %
Lymphocytes Relative: 8 %
Lymphs Abs: 0.8 10*3/uL (ref 0.7–4.0)
MCH: 28.2 pg (ref 26.0–34.0)
MCHC: 29.6 g/dL — ABNORMAL LOW (ref 30.0–36.0)
MCV: 95.1 fL (ref 80.0–100.0)
Monocytes Absolute: 0.7 10*3/uL (ref 0.1–1.0)
Monocytes Relative: 7 %
Neutro Abs: 8.2 10*3/uL — ABNORMAL HIGH (ref 1.7–7.7)
Neutrophils Relative %: 84 %
Platelets: 652 10*3/uL — ABNORMAL HIGH (ref 150–400)
RBC: 2.66 MIL/uL — ABNORMAL LOW (ref 3.87–5.11)
RDW: 18.3 % — ABNORMAL HIGH (ref 11.5–15.5)
WBC: 9.7 10*3/uL (ref 4.0–10.5)
nRBC: 0 % (ref 0.0–0.2)

## 2018-10-24 LAB — FOLATE: Folate: 17.6 ng/mL (ref >5.9)

## 2018-10-24 LAB — IRON AND TIBC
Iron: 7 ug/dL — ABNORMAL LOW (ref 28–170)
Saturation Ratios: 5 % — ABNORMAL LOW (ref 10.4–31.8)
TIBC: 145 ug/dL — ABNORMAL LOW (ref 250–450)
UIBC: 138 ug/dL

## 2018-10-24 LAB — FERRITIN: Ferritin: 363 ng/mL — ABNORMAL HIGH (ref 11–307)

## 2018-10-24 LAB — VITAMIN B12: Vitamin B-12: 492 pg/mL (ref 180–914)

## 2018-10-24 LAB — RETICULOCYTES
Immature Retic Fract: 15.8 % (ref 2.3–15.9)
RBC.: 2.39 MIL/uL — ABNORMAL LOW (ref 3.87–5.11)
Retic Count, Absolute: 22.2 10*3/uL (ref 19.0–186.0)
Retic Ct Pct: 0.9 % (ref 0.4–3.1)

## 2018-10-24 LAB — LIPASE, BLOOD: Lipase: 19 U/L (ref 11–51)

## 2018-10-24 MED ORDER — IOHEXOL 300 MG/ML  SOLN
60.0000 mL | Freq: Once | INTRAMUSCULAR | Status: AC | PRN
  Administered 2018-10-24: 60 mL via INTRAVENOUS

## 2018-10-24 MED ORDER — SODIUM CHLORIDE 0.9 % IV BOLUS
500.0000 mL | Freq: Once | INTRAVENOUS | Status: AC
  Administered 2018-10-24: 500 mL via INTRAVENOUS

## 2018-10-24 NOTE — ED Triage Notes (Signed)
Patient comes the ED with generalized pain "all over".  Patient states she was in the hospital a month ago for the same thing and had diagnosed with shingles, she denies a rash or fever, vomiting or diarrhea. Patient feels she has lost weight.

## 2018-10-24 NOTE — ED Provider Notes (Signed)
Emergency Department Provider Note   I have reviewed the triage vital signs and the nursing notes.   HISTORY  Chief Complaint Generalized Body Aches   HPI Alison Roberson is a 85 y.o. female with PMH of HTN, HLD, and arthritis presents to the emergency department for evaluation of generalized abdominal discomfort with unintentional weight loss.  Patient has had several months of pain and her daughter states she is lost approximately 50 to 60 pounds over the last 2 years.  The daughter states that the patient is fixated on this being shingles, which she has had in the past, but that was over 3 years ago.  She denies rash.  She has not had fever, vomiting, diarrhea.  The patient reports that she has a good appetite but the daughter tells me that she is actually not eating or drinking very much at all.  She was admitted to [facility] about 1 month ago for "dehydration."  The daughter states that she was not updated by phone but believes that her mom may have received a blood transfusion as well.  Unclear if any other tests were done during that hospitalization. The patent cannot recall.  Denies any radiation of symptoms or other modifying factors.  No chest pain or shortness of breath.  No headaches.  No numbness, tingling, weakness.    Past Medical History:  Diagnosis Date  . Anxiety   . Arthritis   . High cholesterol   . History of shingles   . Hypertension     There are no active problems to display for this patient.   Past Surgical History:  Procedure Laterality Date  . CATARACT EXTRACTION Left   . CATARACT EXTRACTION W/PHACO Right 08/16/2017   Procedure: CATARACT EXTRACTION PHACO AND INTRAOCULAR LENS PLACEMENT (IOC);  Surgeon: Baruch Goldmann, MD;  Location: [facility];  Service: Ophthalmology;  Laterality: Right;  CDE: 18.89    Allergies Patient has no known allergies.  Family History  Problem Relation Age of Onset  . Diabetes Sister   . Cancer Brother     Social  History Social History   Tobacco Use  . Smoking status: Former Smoker    Packs/day: 0.02    Years: 40.00    Pack years: 0.80    Types: Cigarettes    Quit date: 02/26/1997    Years since quitting: 21.6  . Smokeless tobacco: Never Used  Substance Use Topics  . Alcohol use: No  . Drug use: No    Review of Systems  Constitutional: No fever/chills. Positive unintentional weight loss.  Eyes: No visual changes. ENT: No sore throat. Cardiovascular: Denies chest pain. Respiratory: Denies shortness of breath. Gastrointestinal: Positive generalized abdominal pain.  No nausea, no vomiting.  No diarrhea.  No constipation. Genitourinary: Negative for dysuria. Musculoskeletal: Negative for back pain. Skin: Negative for rash. Neurological: Negative for headaches, focal weakness or numbness.  10-point ROS otherwise negative.  ____________________________________________   PHYSICAL EXAM:  VITAL SIGNS: ED Triage Vitals  Enc Vitals Group     BP 10/24/18 1156 (!) 142/83     Pulse Rate 10/24/18 1156 98     Resp 10/24/18 1156 14     Temp --      Temp src --      SpO2 10/24/18 1156 99 %     Weight 10/24/18 1157 85 lb (38.6 kg)     Height 10/24/18 1157 5\' 5"  (1.651 m)   Constitutional: Alert and oriented. Well appearing and in no acute distress.  Patient is very thin.  Eyes: Conjunctivae are normal.  Head: Atraumatic. Nose: No congestion/rhinnorhea. Mouth/Throat: Mucous membranes are moist.  Neck: No stridor.  Cardiovascular: Normal rate, regular rhythm. Good peripheral circulation. Grossly normal heart sounds.   Respiratory: Normal respiratory effort.  No retractions. Lungs CTAB. Gastrointestinal: Soft with mild tenderness in the right abdomen. No rebound or guarding. No distention.  Musculoskeletal: No lower extremity tenderness nor edema. No gross deformities of extremities. Neurologic:  Normal speech and language. No gross focal neurologic deficits are appreciated.  Skin:  Skin is  warm, dry and intact. No rash noted.  ____________________________________________   LABS (all labs ordered are listed, but only abnormal results are displayed)  Labs Reviewed  COMPREHENSIVE METABOLIC PANEL - Abnormal; Notable for the following components:      Result Value   CO2 21 (*)    Glucose, Bld 107 (*)    Creatinine, Ser 1.07 (*)    Albumin 2.7 (*)    AST 12 (*)    GFR calc non Af Amer 47 (*)    GFR calc Af Amer 55 (*)    All other components within normal limits  CBC WITH DIFFERENTIAL/PLATELET - Abnormal; Notable for the following components:   RBC 2.66 (*)    Hemoglobin 7.5 (*)    HCT 25.3 (*)    MCHC 29.6 (*)    RDW 18.3 (*)    Platelets 652 (*)    Neutro Abs 8.2 (*)    All other components within normal limits  URINALYSIS, ROUTINE W REFLEX MICROSCOPIC - Abnormal; Notable for the following components:   Color, Urine STRAW (*)    Protein, ur 30 (*)    All other components within normal limits  IRON AND TIBC - Abnormal; Notable for the following components:   Iron 7 (*)    TIBC 145 (*)    Saturation Ratios 5 (*)    All other components within normal limits  FERRITIN - Abnormal; Notable for the following components:   Ferritin 363 (*)    All other components within normal limits  RETICULOCYTES - Abnormal; Notable for the following components:   RBC. 2.39 (*)    All other components within normal limits  LIPASE, BLOOD  VITAMIN B12  FOLATE  POC OCCULT BLOOD, ED   ____________________________________________  RADIOLOGY  Ct Abdomen Pelvis W Contrast  Result Date: 10/24/2018 CLINICAL DATA:  85 year old female with acute diffuse abdominal and pelvic pain. EXAM: CT ABDOMEN AND PELVIS WITH CONTRAST TECHNIQUE: Multidetector CT imaging of the abdomen and pelvis was performed using the standard protocol following bolus administration of intravenous contrast. CONTRAST:  37mL OMNIPAQUE IOHEXOL 300 MG/ML  SOLN COMPARISON:  06/25/2015 CT and prior studies FINDINGS: Lower  chest: On the 1st image, a 1 cm nodular opacity is identified within the RIGHT middle lobe. Hepatobiliary: The liver and gallbladder are unremarkable. No biliary dilatation. Pancreas: Unremarkable Spleen: Unremarkable Adrenals/Urinary Tract: Bilateral renal cortical thinning and renal cysts are present. Renal vascular calcifications are noted. No evidence of hydronephrosis or obstructing urinary calculi. Stomach/Bowel: Stomach is within normal limits. The appendix is not definitely identified. No evidence of bowel wall thickening, distention, or inflammatory changes. Vascular/Lymphatic: Aortic atherosclerosis. No enlarged abdominal or pelvic lymph nodes. Reproductive: Uterus and bilateral adnexa are unremarkable. Other: A small amount of free fluid within the pelvis is nonspecific. No evidence of focal collection or pneumoperitoneum. Musculoskeletal: No acute or suspicious bony abnormalities. Degenerative changes within the lumbar spine identified. IMPRESSION: 1. Small amount of nonspecific free  fluid within the pelvis - indeterminate origin. 2. Otherwise, no acute abnormality identified within the abdomen or pelvis. 3. 1 cm nodular opacity within the RIGHT middle lobe on the 1st image. This cannot be definitively characterized as atelectasis on this single images and chest CT with contrast is recommended for further evaluation. 4.  Aortic Atherosclerosis (ICD10-I70.0). Electronically Signed   By: Margarette Canada M.D.   On: 10/24/2018 14:10    ____________________________________________   PROCEDURES  Procedure(s) performed:   Procedures  None ____________________________________________   INITIAL IMPRESSION / ASSESSMENT AND PLAN / ED COURSE  Pertinent labs & imaging results that were available during my care of the patient were reviewed by me and considered in my medical decision making (see chart for details).   Patient presents to the emergency department for evaluation of abdominal pain with  report of tensional weight loss over the past 1 to 2 years.  Unclear the work-up that is been done on this to date.  Patient does have focal abdominal tenderness in the right abdomen.  No rebound or guarding.  No rash to suspect shingles.  Plan for CT imaging of the abdomen pelvis along with screening labs and IV fluids here.  Patient is awake, alert, very well-appearing overall but is noted to be very thin.   CT reviewed. No acute findings. Lund nodule discussed. PCP for follow up. Patient with Hb of 7.5. Hemoccult negative. Normal vitals. Spoke with hematology Dr. Tera Helper, who will call patient on Monday for appointment in the coming week for outpatient mgmt. Discussed with daughter as well. Patient and family comfortable with the plan at discharge.  ____________________________________________  FINAL CLINICAL IMPRESSION(S) / ED DIAGNOSES  Final diagnoses:  Generalized abdominal pain  Anemia, unspecified type  Lung nodule     MEDICATIONS GIVEN DURING THIS VISIT:  Medications  sodium chloride 0.9 % bolus 500 mL (0 mLs Intravenous Stopped 10/24/18 1602)  iohexol (OMNIPAQUE) 300 MG/ML solution 60 mL (60 mLs Intravenous Contrast Given 10/24/18 1347)    Note:  This document was prepared using Dragon voice recognition software and may include unintentional dictation errors.  Nanda Quinton, MD Emergency Medicine    Serenity Batley, [facility], MD 10/25/18 [phone]

## 2018-10-24 NOTE — ED Provider Notes (Signed)
Pt informed of results - family member at bedside -  Pt stable appearing - anemic - recent transfusion Stable for d/c onc f/u on paper - They are agreeable.   Noemi Chapel, MD 10/24/18 [phone]

## 2018-10-24 NOTE — Discharge Instructions (Signed)
You were seen in the emergency department today with abdominal pain.  Your CT scan showed a nodule in the lung.  Please call your primary care doctor who can schedule a CT scan of the chest to further evaluate this.  We did not otherwise find a reason for your pain.  You do have low hemoglobin or anemia.  I have spoken with the hematology doctor here at the hospital.  They will be calling you on Monday to schedule a follow-up appointment.

## 2018-11-04 ENCOUNTER — Encounter (HOSPITAL_COMMUNITY): Payer: Self-pay

## 2018-11-05 ENCOUNTER — Encounter (HOSPITAL_COMMUNITY): Payer: Self-pay

## 2018-11-05 ENCOUNTER — Observation Stay (HOSPITAL_COMMUNITY): Payer: Medicare HMO

## 2018-11-05 ENCOUNTER — Inpatient Hospital Stay (HOSPITAL_COMMUNITY)
Admission: EM | Admit: 2018-11-05 | Discharge: 2018-11-08 | DRG: 811 | Disposition: A | Discharge status: Hospice | Payer: Medicare HMO | Attending: Internal Medicine | Admitting: Internal Medicine

## 2018-11-05 ENCOUNTER — Inpatient Hospital Stay (HOSPITAL_COMMUNITY): Payer: Medicare HMO | Attending: Hematology | Admitting: Hematology

## 2018-11-05 ENCOUNTER — Inpatient Hospital Stay (HOSPITAL_COMMUNITY): Payer: Medicare HMO

## 2018-11-05 ENCOUNTER — Other Ambulatory Visit: Payer: Self-pay

## 2018-11-05 ENCOUNTER — Emergency Department (HOSPITAL_COMMUNITY)
Admission: EM | Admit: 2018-11-05 | Discharge: 2018-11-05 | Discharge status: Absent without leave | Payer: Medicare HMO | Source: Home / Self Care

## 2018-11-05 VITALS — BP 152/59 | HR 95 | Temp 98.0°F | Resp 19 | Wt 80.0 lb

## 2018-11-05 DIAGNOSIS — Z7189 Other specified counseling: Secondary | ICD-10-CM

## 2018-11-05 DIAGNOSIS — R911 Solitary pulmonary nodule: Secondary | ICD-10-CM | POA: Diagnosis not present

## 2018-11-05 DIAGNOSIS — M1990 Unspecified osteoarthritis, unspecified site: Secondary | ICD-10-CM | POA: Diagnosis present

## 2018-11-05 DIAGNOSIS — Z515 Encounter for palliative care: Secondary | ICD-10-CM

## 2018-11-05 DIAGNOSIS — C342 Malignant neoplasm of middle lobe, bronchus or lung: Secondary | ICD-10-CM | POA: Diagnosis present

## 2018-11-05 DIAGNOSIS — D649 Anemia, unspecified: Secondary | ICD-10-CM

## 2018-11-05 DIAGNOSIS — R0609 Other forms of dyspnea: Secondary | ICD-10-CM | POA: Diagnosis not present

## 2018-11-05 DIAGNOSIS — E43 Unspecified severe protein-calorie malnutrition: Secondary | ICD-10-CM | POA: Diagnosis present

## 2018-11-05 DIAGNOSIS — M545 Low back pain: Secondary | ICD-10-CM | POA: Diagnosis present

## 2018-11-05 DIAGNOSIS — D500 Iron deficiency anemia secondary to blood loss (chronic): Principal | ICD-10-CM | POA: Diagnosis present

## 2018-11-05 DIAGNOSIS — Z20828 Contact with and (suspected) exposure to other viral communicable diseases: Secondary | ICD-10-CM | POA: Diagnosis not present

## 2018-11-05 DIAGNOSIS — Z823 Family history of stroke: Secondary | ICD-10-CM

## 2018-11-05 DIAGNOSIS — F0390 Unspecified dementia without behavioral disturbance: Secondary | ICD-10-CM | POA: Insufficient documentation

## 2018-11-05 DIAGNOSIS — Z79899 Other long term (current) drug therapy: Secondary | ICD-10-CM | POA: Insufficient documentation

## 2018-11-05 DIAGNOSIS — Z800 Family history of malignant neoplasm of digestive organs: Secondary | ICD-10-CM

## 2018-11-05 DIAGNOSIS — R64 Cachexia: Secondary | ICD-10-CM | POA: Diagnosis present

## 2018-11-05 DIAGNOSIS — Z66 Do not resuscitate: Secondary | ICD-10-CM | POA: Diagnosis not present

## 2018-11-05 DIAGNOSIS — R42 Dizziness and giddiness: Secondary | ICD-10-CM | POA: Insufficient documentation

## 2018-11-05 DIAGNOSIS — R7989 Other specified abnormal findings of blood chemistry: Secondary | ICD-10-CM | POA: Diagnosis present

## 2018-11-05 DIAGNOSIS — C3411 Malignant neoplasm of upper lobe, right bronchus or lung: Secondary | ICD-10-CM

## 2018-11-05 DIAGNOSIS — Z87891 Personal history of nicotine dependence: Secondary | ICD-10-CM | POA: Insufficient documentation

## 2018-11-05 DIAGNOSIS — R590 Localized enlarged lymph nodes: Secondary | ICD-10-CM | POA: Diagnosis present

## 2018-11-05 DIAGNOSIS — N183 Chronic kidney disease, stage 3 unspecified: Secondary | ICD-10-CM

## 2018-11-05 DIAGNOSIS — R102 Pelvic and perineal pain: Secondary | ICD-10-CM | POA: Diagnosis not present

## 2018-11-05 DIAGNOSIS — Z681 Body mass index (BMI) 19 or less, adult: Secondary | ICD-10-CM

## 2018-11-05 DIAGNOSIS — D473 Essential (hemorrhagic) thrombocythemia: Secondary | ICD-10-CM | POA: Diagnosis present

## 2018-11-05 DIAGNOSIS — R531 Weakness: Secondary | ICD-10-CM

## 2018-11-05 DIAGNOSIS — I471 Supraventricular tachycardia: Secondary | ICD-10-CM

## 2018-11-05 DIAGNOSIS — M549 Dorsalgia, unspecified: Secondary | ICD-10-CM | POA: Diagnosis present

## 2018-11-05 DIAGNOSIS — R627 Adult failure to thrive: Secondary | ICD-10-CM

## 2018-11-05 DIAGNOSIS — N281 Cyst of kidney, acquired: Secondary | ICD-10-CM | POA: Insufficient documentation

## 2018-11-05 DIAGNOSIS — R634 Abnormal weight loss: Secondary | ICD-10-CM | POA: Insufficient documentation

## 2018-11-05 DIAGNOSIS — R918 Other nonspecific abnormal finding of lung field: Secondary | ICD-10-CM

## 2018-11-05 DIAGNOSIS — R52 Pain, unspecified: Secondary | ICD-10-CM | POA: Diagnosis not present

## 2018-11-05 DIAGNOSIS — G8929 Other chronic pain: Secondary | ICD-10-CM | POA: Diagnosis present

## 2018-11-05 DIAGNOSIS — N179 Acute kidney failure, unspecified: Secondary | ICD-10-CM | POA: Diagnosis present

## 2018-11-05 DIAGNOSIS — I700 Atherosclerosis of aorta: Secondary | ICD-10-CM | POA: Insufficient documentation

## 2018-11-05 DIAGNOSIS — N189 Chronic kidney disease, unspecified: Secondary | ICD-10-CM | POA: Insufficient documentation

## 2018-11-05 DIAGNOSIS — E785 Hyperlipidemia, unspecified: Secondary | ICD-10-CM | POA: Diagnosis present

## 2018-11-05 DIAGNOSIS — D75839 Thrombocytosis, unspecified: Secondary | ICD-10-CM | POA: Diagnosis present

## 2018-11-05 DIAGNOSIS — E7800 Pure hypercholesterolemia, unspecified: Secondary | ICD-10-CM | POA: Diagnosis present

## 2018-11-05 LAB — RETICULOCYTES
Immature Retic Fract: 16.9 % — ABNORMAL HIGH (ref 2.3–15.9)
Immature Retic Fract: 15.8 % (ref 2.3–15.9)
RBC.: 2.51 MIL/uL — ABNORMAL LOW (ref 3.87–5.11)
RBC.: 2.51 MIL/uL — ABNORMAL LOW (ref 3.87–5.11)
Retic Count, Absolute: 26.4 10*3/uL (ref 19.0–186.0)
Retic Count, Absolute: 27.4 10*3/uL (ref 19.0–186.0)
Retic Ct Pct: 1.1 % (ref 0.4–3.1)
Retic Ct Pct: 1.1 % (ref 0.4–3.1)

## 2018-11-05 LAB — COMPREHENSIVE METABOLIC PANEL
ALT: 13 U/L (ref 0–44)
AST: 16 U/L (ref 15–41)
Albumin: 2.7 g/dL — ABNORMAL LOW (ref 3.5–5.0)
Alkaline Phosphatase: 103 U/L (ref 38–126)
Anion gap: 12 (ref 5–15)
BUN: 26 mg/dL — ABNORMAL HIGH (ref 8–23)
CO2: 22 mmol/L (ref 22–32)
Calcium: 9.2 mg/dL (ref 8.9–10.3)
Chloride: 105 mmol/L (ref 98–111)
Creatinine, Ser: 1.3 mg/dL — ABNORMAL HIGH (ref 0.44–1.00)
GFR calc Af Amer: 43 mL/min — ABNORMAL LOW (ref >60)
GFR calc non Af Amer: 37 mL/min — ABNORMAL LOW (ref >60)
Glucose, Bld: 123 mg/dL — ABNORMAL HIGH (ref 70–99)
Potassium: 5.4 mmol/L — ABNORMAL HIGH (ref 3.5–5.1)
Sodium: 139 mmol/L (ref 135–145)
Total Bilirubin: 0.7 mg/dL (ref 0.3–1.2)
Total Protein: 7.3 g/dL (ref 6.5–8.1)

## 2018-11-05 LAB — CBC WITH DIFFERENTIAL/PLATELET
Abs Immature Granulocytes: 0.05 10*3/uL (ref 0.00–0.07)
Basophils Absolute: 0 10*3/uL (ref 0.0–0.1)
Basophils Relative: 0 %
Eosinophils Absolute: 0 10*3/uL (ref 0.0–0.5)
Eosinophils Relative: 0 %
HCT: 23.9 % — ABNORMAL LOW (ref 36.0–46.0)
Hemoglobin: 7.1 g/dL — ABNORMAL LOW (ref 12.0–15.0)
Immature Granulocytes: 0 %
Lymphocytes Relative: 5 %
Lymphs Abs: 0.6 10*3/uL — ABNORMAL LOW (ref 0.7–4.0)
MCH: 28.3 pg (ref 26.0–34.0)
MCHC: 29.7 g/dL — ABNORMAL LOW (ref 30.0–36.0)
MCV: 95.2 fL (ref 80.0–100.0)
Monocytes Absolute: 0.6 10*3/uL (ref 0.1–1.0)
Monocytes Relative: 5 %
Neutro Abs: 10.1 10*3/uL — ABNORMAL HIGH (ref 1.7–7.7)
Neutrophils Relative %: 90 %
Platelets: 705 10*3/uL — ABNORMAL HIGH (ref 150–400)
RBC: 2.51 MIL/uL — ABNORMAL LOW (ref 3.87–5.11)
RDW: 17.3 % — ABNORMAL HIGH (ref 11.5–15.5)
WBC: 11.3 10*3/uL — ABNORMAL HIGH (ref 4.0–10.5)
nRBC: 0 % (ref 0.0–0.2)

## 2018-11-05 LAB — PROTIME-INR
INR: 1.1 (ref 0.8–1.2)
Prothrombin Time: 13.9 seconds (ref 11.4–15.2)

## 2018-11-05 LAB — ABO/RH: ABO/RH(D): A POS

## 2018-11-05 LAB — FOLATE: Folate: 19.1 ng/mL (ref >5.9)

## 2018-11-05 LAB — SARS CORONAVIRUS 2 BY RT PCR (HOSPITAL ORDER, PERFORMED IN ~~LOC~~ HOSPITAL LAB): SARS Coronavirus 2: NEGATIVE

## 2018-11-05 LAB — IRON AND TIBC
Iron: 7 ug/dL — ABNORMAL LOW (ref 28–170)
Saturation Ratios: 5 % — ABNORMAL LOW (ref 10.4–31.8)
TIBC: 156 ug/dL — ABNORMAL LOW (ref 250–450)
UIBC: 149 ug/dL

## 2018-11-05 LAB — PREPARE RBC (CROSSMATCH)

## 2018-11-05 LAB — TROPONIN I (HIGH SENSITIVITY)
Troponin I (High Sensitivity): 13 ng/L (ref <18)
Troponin I (High Sensitivity): 14 ng/L (ref <18)

## 2018-11-05 LAB — TSH: TSH: 8.681 u[IU]/mL — ABNORMAL HIGH (ref 0.350–4.500)

## 2018-11-05 LAB — FERRITIN: Ferritin: 490 ng/mL — ABNORMAL HIGH (ref 11–307)

## 2018-11-05 LAB — VITAMIN B12: Vitamin B-12: 586 pg/mL (ref 180–914)

## 2018-11-05 LAB — LACTATE DEHYDROGENASE: LDH: 114 U/L (ref 98–192)

## 2018-11-05 MED ORDER — ONDANSETRON HCL 4 MG PO TABS: 4.0000 mg | ORAL_TABLET | Freq: Four times a day (QID) | ORAL | Status: DC | PRN

## 2018-11-05 MED ORDER — IOHEXOL 300 MG/ML  SOLN
75.0000 mL | Freq: Once | INTRAMUSCULAR | Status: AC | PRN
  Administered 2018-11-05: 75 mL via INTRAVENOUS

## 2018-11-05 MED ORDER — ENOXAPARIN SODIUM 30 MG/0.3ML ~~LOC~~ SOLN
30.0000 mg | SUBCUTANEOUS | Status: DC
  Administered 2018-11-05 – 2018-11-07 (×3): 30 mg via SUBCUTANEOUS
  Filled 2018-11-05 (×3): qty 0.3

## 2018-11-05 MED ORDER — ONDANSETRON HCL 4 MG/2ML IJ SOLN: 4.0000 mg | Freq: Four times a day (QID) | INTRAMUSCULAR | Status: DC | PRN

## 2018-11-05 MED ORDER — ACETAMINOPHEN 650 MG RE SUPP: 650.0000 mg | Freq: Four times a day (QID) | RECTAL | Status: DC | PRN

## 2018-11-05 MED ORDER — SODIUM CHLORIDE 0.9 % IV BOLUS
500.0000 mL | Freq: Once | INTRAVENOUS | Status: AC
  Administered 2018-11-05: 500 mL via INTRAVENOUS

## 2018-11-05 MED ORDER — SODIUM CHLORIDE 0.9 % IV SOLN
INTRAVENOUS | Status: AC
  Administered 2018-11-06: 03:00:00 via INTRAVENOUS

## 2018-11-05 MED ORDER — INFLUENZA VAC A&B SA ADJ QUAD 0.5 ML IM PRSY
0.5000 mL | PREFILLED_SYRINGE | INTRAMUSCULAR | Status: DC
  Filled 2018-11-05: qty 0.5

## 2018-11-05 MED ORDER — PNEUMOCOCCAL VAC POLYVALENT 25 MCG/0.5ML IJ INJ
0.5000 mL | INJECTION | INTRAMUSCULAR | Status: DC
  Filled 2018-11-05: qty 0.5

## 2018-11-05 MED ORDER — SODIUM CHLORIDE 0.9 % IV SOLN
10.0000 mL/h | Freq: Once | INTRAVENOUS | Status: AC
  Administered 2018-11-05: 10 mL/h via INTRAVENOUS

## 2018-11-05 MED ORDER — ACETAMINOPHEN 325 MG PO TABS
650.0000 mg | ORAL_TABLET | Freq: Four times a day (QID) | ORAL | Status: DC | PRN
  Administered 2018-11-07: 650 mg via ORAL
  Filled 2018-11-05: qty 2

## 2018-11-05 MED ORDER — SENNOSIDES-DOCUSATE SODIUM 8.6-50 MG PO TABS: 1.0000 | ORAL_TABLET | Freq: Every evening | ORAL | Status: DC | PRN

## 2018-11-05 NOTE — Progress Notes (Signed)
CONSULT NOTE  Patient Care Team: Neale Burly, MD as PCP - General (Internal Medicine)  CHIEF COMPLAINTS/PURPOSE OF CONSULTATION:  Normocytic anemia.  HISTORY OF PRESENTING ILLNESS:  Alison Roberson 85 y.o. female is seen in consultation today at the request of Dr. Laverta Baltimore from ER.  Patient recently came to the ER on 10/24/2018 with generalized body aches and weakness.  CBC showed hemoglobin 7.5 with a normal white count and platelet count.  MCV was 95.  Creatinine was elevated at 1.07.  Patient reportedly had a blood transfusion in [facility] about 1 to 2 months ago.  She has occasional lightheadedness but denies any chest pains on exertion.  Has dyspnea on exertion.  Patient lives at home by herself.  She is accompanied by her daughter today who lives in [place].  She reportedly quit smoking 20 minutes ago.  Smoked for more than 30 years.  She worked Education administrator houses.  Family history significant for brother who died of colon cancer.  Sister also had cancer, type unknown to the patient.  She never had a colonoscopy.  She denies any over-the-counter NSAID ingestion.  Denies any epistaxis, hematuria or hematochezia.  She is not on any antiplatelet agents or blood thinners.  She denies any pica and reports decrease in appetite and taste sensation.  She is not on any iron supplements at this time.  She reported to have lost about 55 pounds in the last 2 years, lost more than a few pounds in the last few months.    MEDICAL HISTORY:  Past Medical History:  Diagnosis Date  . Anemia   . Anxiety   . Arthritis   . High cholesterol   . History of shingles     SURGICAL HISTORY: Past Surgical History:  Procedure Laterality Date  . CATARACT EXTRACTION Left   . CATARACT EXTRACTION W/PHACO Right 08/16/2017   Procedure: CATARACT EXTRACTION PHACO AND INTRAOCULAR LENS PLACEMENT (IOC);  Surgeon: Baruch Goldmann, MD;  Location: [facility];  Service: Ophthalmology;  Laterality: Right;  CDE: 18.89     SOCIAL HISTORY: Social History   Socioeconomic History  . Marital status: Widowed    Spouse name: Not on file  . Number of children: 4  . Years of education: Not on file  . Highest education level: Not on file  Occupational History  . Occupation: retired  Scientific laboratory technician  . Financial resource strain: Not on file  . Food insecurity    Worry: Not on file    Inability: Not on file  . Transportation needs    Medical: Not on file    Non-medical: Not on file  Tobacco Use  . Smoking status: Former Smoker    Packs/day: 0.02    Years: 40.00    Pack years: 0.80    Types: Cigarettes    Quit date: 02/26/1997    Years since quitting: 21.7  . Smokeless tobacco: Never Used  Substance and Sexual Activity  . Alcohol use: No  . Drug use: No  . Sexual activity: Not Currently    Birth control/protection: None  Lifestyle  . Physical activity    Days per week: Not on file    Minutes per session: Not on file  . Stress: Not on file  Relationships  . Social Herbalist on phone: Not on file    Gets together: Not on file    Attends religious service: Not on file    Active member of club or organization: Not on  file    Attends meetings of clubs or organizations: Not on file    Relationship status: Not on file  . Intimate partner violence    Fear of current or ex partner: Not on file    Emotionally abused: Not on file    Physically abused: Not on file    Forced sexual activity: Not on file  Other Topics Concern  . Not on file  Social History Narrative  . Not on file    FAMILY HISTORY: Family History  Problem Relation Age of Onset  . Cancer Brother   . Stroke Daughter     ALLERGIES:  has No Known Allergies.  MEDICATIONS:  Current Outpatient Medications  Medication Sig Dispense Refill  . acetaminophen (TYLENOL) 500 MG tablet Take 500-1,000 mg by mouth every 6 (six) hours as needed.     No current facility-administered medications for this visit.     REVIEW OF  SYSTEMS:   Constitutional: Denies fevers, chills or abnormal night sweats.  54 pound weight loss in the last 2 years. Eyes: Denies blurriness of vision, double vision or watery eyes Ears, nose, mouth, throat, and face: Denies mucositis or sore throat Respiratory: Denies any cough or wheezing.  Dyspnea on exertion present. Cardiovascular: Denies palpitation, chest discomfort or lower extremity swelling Gastrointestinal:  Denies nausea, heartburn or change in bowel habits.  She does report occasional diarrhea. Skin: Denies abnormal skin rashes Lymphatics: Denies new lymphadenopathy or easy bruising Neurological:Denies numbness, tingling or new weaknesses Behavioral/Psych: Mood is stable, no new changes  All other systems were reviewed with the patient and are negative.  PHYSICAL EXAMINATION: ECOG PERFORMANCE STATUS: 2 - Symptomatic, <50% confined to bed  Vitals:   11/05/18 1359  BP: (!) 103/51  Pulse: (!) 118  Resp: 16  Temp: (!) 97.3 F (36.3 C)  SpO2: 100%   Filed Weights   11/05/18 1359  Weight: 80 lb (36.3 kg)    GENERAL:alert, no distress and comfortable SKIN: skin color, texture, turgor are normal, no rashes or significant lesions EYES: normal, conjunctiva are pink and non-injected, sclera clear OROPHARYNX:no exudate, no erythema and lips, buccal mucosa, and tongue normal  NECK: supple, thyroid normal size, non-tender, without nodularity LYMPH:  no palpable lymphadenopathy in the cervical, axillary or inguinal LUNGS: clear to auscultation and percussion with normal breathing effort HEART: regular rate & rhythm and no murmurs and no lower extremity edema ABDOMEN:abdomen soft, non-tender and normal bowel sounds Musculoskeletal:no cyanosis of digits and no clubbing  PSYCH: alert & oriented x 3 with fluent speech NEURO: no focal motor/sensory deficits  LABORATORY DATA:  I have reviewed the data as listed Recent Results (from the past 2160 hour(s))  Urinalysis, Routine w  reflex microscopic     Status: Abnormal   Collection Time: 10/24/18 12:17 PM  Result Value Ref Range   Color, Urine STRAW (A) YELLOW   APPearance CLEAR CLEAR   Specific Gravity, Urine 1.026 1.005 - 1.030   pH 5.0 5.0 - 8.0   Glucose, UA NEGATIVE NEGATIVE mg/dL   Hgb urine dipstick NEGATIVE NEGATIVE   Bilirubin Urine NEGATIVE NEGATIVE   Ketones, ur NEGATIVE NEGATIVE mg/dL   Protein, ur 30 (A) NEGATIVE mg/dL   Nitrite NEGATIVE NEGATIVE   Leukocytes,Ua NEGATIVE NEGATIVE   RBC / HPF 0-5 0 - 5 RBC/hpf   WBC, UA 0-5 0 - 5 WBC/hpf   Bacteria, UA NONE SEEN NONE SEEN   Squamous Epithelial / LPF 0-5 0 - 5   Hyaline Casts, UA PRESENT  Comment: Performed at [facility], [address], [place], [place] [address]  Comprehensive metabolic panel     Status: Abnormal   Collection Time: 10/24/18 12:38 PM  Result Value Ref Range   Sodium 141 135 - 145 mmol/L   Potassium 4.9 3.5 - 5.1 mmol/L   Chloride 110 98 - 111 mmol/L   CO2 21 (L) 22 - 32 mmol/L   Glucose, Bld 107 (H) 70 - 99 mg/dL   BUN 16 8 - 23 mg/dL   Creatinine, Ser 1.07 (H) 0.44 - 1.00 mg/dL   Calcium 9.4 8.9 - 10.3 mg/dL   Total Protein 7.3 6.5 - 8.1 g/dL   Albumin 2.7 (L) 3.5 - 5.0 g/dL   AST 12 (L) 15 - 41 U/L   ALT 9 0 - 44 U/L   Alkaline Phosphatase 119 38 - 126 U/L   Total Bilirubin 0.4 0.3 - 1.2 mg/dL   GFR calc non Af Amer 47 (L) >60 mL/min   GFR calc Af Amer 55 (L) >60 mL/min   Anion gap 10 5 - 15    Comment: Performed at [facility], [address] [place], [place] [address]  Lipase, blood     Status: None   Collection Time: 10/24/18 12:38 PM  Result Value Ref Range   Lipase 19 11 - 51 U/L    Comment: Performed at [facility], [address], [place], [place] [address]  CBC with Differential     Status: Abnormal   Collection Time: 10/24/18 12:38 PM  Result Value Ref Range   WBC 9.7 4.0 - 10.5 K/uL   RBC 2.66 (L) 3.87 - 5.11 MIL/uL   Hemoglobin 7.5 (L) 12.0 - 15.0 g/dL   HCT 25.3 (L) 36.0 - 46.0 %   MCV 95.1  80.0 - 100.0 fL   MCH 28.2 26.0 - 34.0 pg   MCHC 29.6 (L) 30.0 - 36.0 g/dL   RDW 18.3 (H) 11.5 - 15.5 %   Platelets 652 (H) 150 - 400 K/uL   nRBC 0.0 0.0 - 0.2 %   Neutrophils Relative % 84 %   Neutro Abs 8.2 (H) 1.7 - 7.7 K/uL   Lymphocytes Relative 8 %   Lymphs Abs 0.8 0.7 - 4.0 K/uL   Monocytes Relative 7 %   Monocytes Absolute 0.7 0.1 - 1.0 K/uL   Eosinophils Relative 0 %   Eosinophils Absolute 0.0 0.0 - 0.5 K/uL   Basophils Relative 0 %   Basophils Absolute 0.0 0.0 - 0.1 K/uL   Immature Granulocytes 1 %   Abs Immature Granulocytes 0.06 0.00 - 0.07 K/uL    Comment: Performed at [facility], [address] [place], [place] [address]  POC occult blood, ED Provider will collect     Status: None   Collection Time: 10/24/18  2:16 PM  Result Value Ref Range   Fecal Occult Bld NEGATIVE NEGATIVE  Vitamin B12     Status: None   Collection Time: 10/24/18  2:27 PM  Result Value Ref Range   Vitamin B-12 492 180 - 914 pg/mL    Comment: (NOTE) This assay is not validated for testing neonatal or myeloproliferative syndrome specimens for Vitamin B12 levels. Performed at [facility], [address] [place], [place] [address]   Folate     Status: None   Collection Time: 10/24/18  2:27 PM  Result Value Ref Range   Folate 17.6 >5.9 ng/mL    Comment: Performed at [facility], [address] [place], [place]  [address]  Iron and TIBC     Status: Abnormal   Collection Time: 10/24/18  2:27 PM  Result Value Ref Range   Iron 7 (L) 28 - 170 ug/dL   TIBC 145 (L) 250 - 450 ug/dL   Saturation Ratios 5 (L) 10.4 - 31.8 %   UIBC 138 ug/dL    Comment: Performed at [facility], [address], [place], [place] [address]  Ferritin     Status: Abnormal   Collection Time: 10/24/18  2:27 PM  Result Value Ref Range   Ferritin 363 (H) 11 - 307 ng/mL    Comment: Performed at [facility], [address] [place], [place] [address]  Reticulocytes     Status: Abnormal   Collection Time: 10/24/18  2:27 PM   Result Value Ref Range   Retic Ct Pct 0.9 0.4 - 3.1 %   RBC. 2.39 (L) 3.87 - 5.11 MIL/uL   Retic Count, Absolute 22.2 19.0 - 186.0 K/uL   Immature Retic Fract 15.8 2.3 - 15.9 %    Comment: Performed at [facility], [address] [place], [place] [address]    RADIOGRAPHIC STUDIES: I have personally reviewed the radiological images as listed and agreed with the findings in the report. Ct Abdomen Pelvis W Contrast  Result Date: 10/24/2018 CLINICAL DATA:  85 year old female with acute diffuse abdominal and pelvic pain. EXAM: CT ABDOMEN AND PELVIS WITH CONTRAST TECHNIQUE: Multidetector CT imaging of the abdomen and pelvis was performed using the standard protocol following bolus administration of intravenous contrast. CONTRAST:  73mL OMNIPAQUE IOHEXOL 300 MG/ML  SOLN COMPARISON:  06/25/2015 CT and prior studies FINDINGS: Lower chest: On the 1st image, a 1 cm nodular opacity is identified within the RIGHT middle lobe. Hepatobiliary: The liver and gallbladder are unremarkable. No biliary dilatation. Pancreas: Unremarkable Spleen: Unremarkable Adrenals/Urinary Tract: Bilateral renal cortical thinning and renal cysts are present. Renal vascular calcifications are noted. No evidence of hydronephrosis or obstructing urinary calculi. Stomach/Bowel: Stomach is within normal limits. The appendix is not definitely identified. No evidence of bowel wall thickening, distention, or inflammatory changes. Vascular/Lymphatic: Aortic atherosclerosis. No enlarged abdominal or pelvic lymph nodes. Reproductive: Uterus and bilateral adnexa are unremarkable. Other: A small amount of free fluid within the pelvis is nonspecific. No evidence of focal collection or pneumoperitoneum. Musculoskeletal: No acute or suspicious bony abnormalities. Degenerative changes within the lumbar spine identified. IMPRESSION: 1. Small amount of nonspecific free fluid within the pelvis - indeterminate origin. 2. Otherwise, no acute abnormality  identified within the abdomen or pelvis. 3. 1 cm nodular opacity within the RIGHT middle lobe on the 1st image. This cannot be definitively characterized as atelectasis on this single images and chest CT with contrast is recommended for further evaluation. 4.  Aortic Atherosclerosis (ICD10-I70.0). Electronically Signed   By: Margarette Canada M.D.   On: 10/24/2018 14:10    ASSESSMENT & PLAN:  Normocytic anemia 1.  Normocytic anemia: -Recent presentation to the ER on 10/24/2018 with generalized body pains and fatigue. -CBC showed hemoglobin of 7.5, MCV of 95 with a normal white count and platelet count. -Patient does have chronic kidney disease based on labs from 2019. - Ferritin was 363 with percent saturation of 5.  U72 and folic acid were normal. -Stool for occult blood was negative in the ER.  Patient reported that she never had colonoscopy. - She reportedly received blood transfusion while she was hospitalized at [facility] 2 months ago. - We will repeat CBC today.  We will check  LDH and reticulocyte count.  We will also check SPEP, copper level and methylmalonic acid levels. -We will check her stool for occult blood x3 days.  RTC 2 to 3 weeks.  2.  Unintentional weight loss: -She had 55 pound weight loss in the last 2 years. -Patient also has mild dementia.  Daughter provides some history.  According to the daughter, she lost more weight recently in the last few months. -She does not have good taste for food. -CT abdomen and pelvis on 10/24/2018 showed no abnormalities in the solid organs.  1 cm nodular opacity within the right middle lobe, definitively cannot be characterized.  This could be atelectasis. -I have recommended doing a CT scan of the chest with contrast. - She had smoked for more than 30 years, quit about 21 years ago.     All questions were answered. The patient knows to call the clinic with any problems, questions or concerns.     Derek Jack, MD 11/05/18 2:49 PM

## 2018-11-05 NOTE — ED Provider Notes (Addendum)
[facility] EMERGENCY DEPARTMENT Provider Note   CSN: [id number] Arrival date & time: 11/05/18  1529     History   Chief Complaint Chief Complaint  Patient presents with  . Fatigue    HPI Alison Roberson is a 85 y.o. female.     Patient brought to the emergency department for weakness.  She has a history of anemia and has been transfused before.  She was seen by her oncologist today for work-up with anemia.  He drew blood on her and also wants to get a CT scan of her chest with contrast.  The history is provided by the patient and a relative. No language interpreter was used.  Weakness Severity:  Moderate Onset quality:  Sudden Timing:  Constant Progression:  Worsening Chronicity:  Recurrent Context: not alcohol use   Relieved by:  Nothing Worsened by:  Nothing Ineffective treatments:  None tried Associated symptoms: no abdominal pain, no chest pain, no cough, no diarrhea, no frequency, no headaches and no seizures     Past Medical History:  Diagnosis Date  . Anemia   . Anxiety   . Arthritis   . High cholesterol   . History of shingles     Patient Active Problem List   Diagnosis Date Noted  . Normocytic anemia 11/05/2018  . CKD (chronic kidney disease) 11/05/2018  . Weight loss, unintentional 11/05/2018  . Symptomatic anemia 11/05/2018    Past Surgical History:  Procedure Laterality Date  . CATARACT EXTRACTION Left   . CATARACT EXTRACTION W/PHACO Right 08/16/2017   Procedure: CATARACT EXTRACTION PHACO AND INTRAOCULAR LENS PLACEMENT (IOC);  Surgeon: Baruch Goldmann, MD;  Location: [facility];  Service: Ophthalmology;  Laterality: Right;  CDE: 18.89     OB History    Gravida  5   Para  5   Term  5   Preterm      AB      Living  5     SAB      TAB      Ectopic      Multiple      Live Births               Home Medications    Prior to Admission medications   Medication Sig Start Date End Date Taking? Authorizing Provider   acetaminophen (TYLENOL) 500 MG tablet Take 500-1,000 mg by mouth every 6 (six) hours as needed.   Yes [provider]    Family History Family History  Problem Relation Age of Onset  . Cancer Brother   . Stroke Daughter     Social History Social History   Tobacco Use  . Smoking status: Former Smoker    Packs/day: 0.02    Years: 40.00    Pack years: 0.80    Types: Cigarettes    Quit date: 02/26/1997    Years since quitting: 21.7  . Smokeless tobacco: Never Used  Substance Use Topics  . Alcohol use: No  . Drug use: No     Allergies   Patient has no known allergies.   Review of Systems Review of Systems  Constitutional: Negative for appetite change and fatigue.  HENT: Negative for congestion, ear discharge and sinus pressure.   Eyes: Negative for discharge.  Respiratory: Negative for cough.   Cardiovascular: Negative for chest pain.  Gastrointestinal: Negative for abdominal pain and diarrhea.  Genitourinary: Negative for frequency and hematuria.  Musculoskeletal: Negative for back pain.  Skin: Negative for rash.  Neurological: Positive  for weakness. Negative for seizures and headaches.  Psychiatric/Behavioral: Negative for hallucinations.     Physical Exam Updated Vital Signs BP (!) 168/71   Pulse 90   Temp 97.7 F (36.5 C) (Oral)   Resp 16   Wt 36.3 kg   SpO2 100%   BMI 13.31 kg/m   Physical Exam Vitals signs and nursing note reviewed.  Constitutional:      Appearance: She is well-developed.  HENT:     Head: Normocephalic.     Nose: Nose normal.  Eyes:     General: No scleral icterus.    Conjunctiva/sclera: Conjunctivae normal.  Neck:     Musculoskeletal: Neck supple.     Thyroid: No thyromegaly.  Cardiovascular:     Rate and Rhythm: Normal rate and regular rhythm.     Heart sounds: No murmur. No friction rub. No gallop.   Pulmonary:     Breath sounds: No stridor. No wheezing or rales.  Chest:     Chest wall: No tenderness.   Abdominal:     General: There is no distension.     Tenderness: There is no abdominal tenderness. There is no rebound.  Musculoskeletal: Normal range of motion.  Lymphadenopathy:     Cervical: No cervical adenopathy.  Skin:    Findings: No erythema or rash.  Neurological:     Mental Status: She is oriented to person, place, and time.     Motor: No abnormal muscle tone.     Coordination: Coordination normal.  Psychiatric:        Behavior: Behavior normal.      ED Treatments / Results  Labs (all labs ordered are listed, but only abnormal results are displayed) Labs Reviewed  SARS CORONAVIRUS 2 (HOSPITAL ORDER, [place] [facility])  CBC WITH DIFFERENTIAL/PLATELET  URINALYSIS, ROUTINE W REFLEX MICROSCOPIC  VITAMIN B12  FOLATE  IRON AND TIBC  FERRITIN  RETICULOCYTES  TYPE AND SCREEN  PREPARE RBC (CROSSMATCH)  ABO/RH  TROPONIN I (HIGH SENSITIVITY)    EKG None  Radiology No results found.  Procedures Procedures (including critical care time)  Medications Ordered in ED Medications  0.9 %  sodium chloride infusion (has no administration in time range)  sodium chloride 0.9 % bolus 500 mL (0 mLs Intravenous Stopped 11/05/18 1745)     Initial Impression / Assessment and Plan / ED Course  I have reviewed the triage vital signs and the nursing notes.  Pertinent labs & imaging results that were available during my care of the patient were reviewed by me and considered in my medical decision making (see chart for details).    CRITICAL CARE Performed by: Milton Ferguson Total critical care time:35 minutes Critical care time was exclusive of separately billable procedures and treating other patients. Critical care was necessary to treat or prevent imminent or life-threatening deterioration. Critical care was time spent personally by me on the following activities: development of treatment plan with patient and/or surrogate as well as nursing, discussions  with consultants, evaluation of patient's response to treatment, examination of patient, obtaining history from patient or surrogate, ordering and performing treatments and interventions, ordering and review of laboratory studies, ordering and review of radiographic studies, pulse oximetry and re-evaluation of patient's condition.      Patient with symptomatic anemia.  I spoke with her oncologist and he wishes her to get admitted and transfused 1 unit of blood and possibly get the CT scan of her chest with contrast while she is in the  hospital for the weight loss  Final Clinical Impressions(s) / ED Diagnoses   Final diagnoses:  Iron deficiency anemia due to chronic blood loss    ED Discharge Orders    None       Milton Ferguson, MD 11/05/18 3846    Milton Ferguson, MD 11/20/18 1213

## 2018-11-05 NOTE — ED Notes (Signed)
Patient complains of unexplained weakness and weight loss.

## 2018-11-05 NOTE — ED Triage Notes (Signed)
Pt has been having ongoing dizziness, lethargy, and nausea for the last few months. Has been seen here for same 2 weeks ago.

## 2018-11-05 NOTE — Assessment & Plan Note (Addendum)
1.  Normocytic anemia: -Recent presentation to the ER on 10/24/2018 with generalized body pains and fatigue. -CBC showed hemoglobin of 7.5, MCV of 95 with a normal white count and platelet count. -Patient does have chronic kidney disease based on labs from 2019. - Ferritin was 363 with percent saturation of 5.  Q75 and folic acid were normal. -Stool for occult blood was negative in the ER.  Patient reported that she never had colonoscopy. - She reportedly received blood transfusion while she was hospitalized at [facility] 2 months ago. - We will repeat CBC today.  We will check LDH and reticulocyte count.  We will also check SPEP, copper level and methylmalonic acid levels. -We will check her stool for occult blood x3 days.  RTC 2 to 3 weeks.  2.  Unintentional weight loss: -She had 55 pound weight loss in the last 2 years. -Patient also has mild dementia.  Daughter provides some history.  According to the daughter, she lost more weight recently in the last few months. -She does not have good taste for food. -CT abdomen and pelvis on 10/24/2018 showed no abnormalities in the solid organs.  1 cm nodular opacity within the right middle lobe, definitively cannot be characterized.  This could be atelectasis. -I have recommended doing a CT scan of the chest with contrast. - She had smoked for more than 30 years, quit about 21 years ago.

## 2018-11-05 NOTE — Patient Instructions (Signed)
[place] at [facility] Discharge Instructions  You were seen today by Dr. Delton Coombes. He went over your history, family history and how you've been feeling lately. He will have blood drawn today. He will see you back in 3 weeks for labs and follow up.   Thank you for choosing [place] at [facility] to provide your oncology and hematology care.  To afford each patient quality time with our provider, please arrive at least 15 minutes before your scheduled appointment time.   If you have a lab appointment with the [facility] please come in thru the  Main Entrance and check in at the main information desk  You need to re-schedule your appointment should you arrive 10 or more minutes late.  We strive to give you quality time with our providers, and arriving late affects you and other patients whose appointments are after yours.  Also, if you no show three or more times for appointments you may be dismissed from the clinic at the providers discretion.     Again, thank you for choosing [facility].  Our hope is that these requests will decrease the amount of time that you wait before being seen by our physicians.       _____________________________________________________________  Should you have questions after your visit to [facility], please contact our office at [phone] between the hours of 8:00 a.m. and 4:30 p.m.  Voicemails left after 4:00 p.m. will not be returned until the following business day.  For prescription refill requests, have your pharmacy contact our office and allow 72 hours.    Cancer Center Support Programs:   > Cancer Support Group  2nd Tuesday of the month 1pm-2pm, Journey Room  '

## 2018-11-05 NOTE — H&P (Signed)
History and Physical    Alison Roberson CHE:[id number] DOB: 20-Oct-1931 DOA: 11/05/2018  PCP: Neale Burly, MD  Patient coming from: Home, lives alone  I have personally briefly reviewed patient's old medical records in [place]  Chief Complaint: Weakness, dizziness  HPI: Alison Roberson is a 85 y.o. female with medical history significant of chronic back pain, CKD stage III who presents with progressive weakness and dizziness.  Patient states that she has had progressive weakness and dizziness over the last several months.  There is some associated nausea, intermittent diarrhea.  Daughter reports that she used to weigh 140 pounds and now has had a roughly 50 pound weight loss over the past 2 years; of unclear etiology.  Patient with history of tobacco abuse, she reports that she quit 27 years ago.  Patient currently lives alone and manage her ADLs independently, although daughter is concerned that she is unable to maintain this lifestyle given her progressive decline.  Patient only takes Tylenol as needed for her chronic back pain.  She states complete a few small meals a day sparingly.  Patient denies any vomiting, hematemesis, and no blood in her stool.  No previous history of colonoscopy.  No other specific complaints at this time.  Was seen earlier today by oncology Dr. Delton Coombes for same complaint.  Starting outpatient work-up with LDH, reticulocyte count, SPEP, copper level, and methylmalonic acid level.  Review of EMR notable for CT abdomen/pelvis on 10/24/2018 with small amount of non-specific free fluid in her pelvis, a 1 cm nodular opacity right lobe, and no acute intra-abdominal abnormality.  ED Course: Temperature 97.7, HR 90, RR 16, BP 168/71, SPO2 100% on room air.  WBC count 11.3, hemoglobin 7.1, platelets 705, MCV 95.2.  Sodium 139, potassium 5.4, chloride 105, CO2 22, BUN 26, creatinine 1.30, glucose 123.  COVID-19 test pending at time of admission.  ED provider  ordered 1 unit PRBC for transfusion.  EDP consulted hospital service for admission given her progressive weakness, dizziness and symptomatic anemia for further observation and evaluation.  Review of Systems: As per HPI otherwise 10 point review of systems negative.    Past Medical History:  Diagnosis Date  . Anemia   . Anxiety   . Arthritis   . High cholesterol   . History of shingles     Past Surgical History:  Procedure Laterality Date  . CATARACT EXTRACTION Left   . CATARACT EXTRACTION W/PHACO Right 08/16/2017   Procedure: CATARACT EXTRACTION PHACO AND INTRAOCULAR LENS PLACEMENT (IOC);  Surgeon: Baruch Goldmann, MD;  Location: [facility];  Service: Ophthalmology;  Laterality: Right;  CDE: 18.89     reports that she quit smoking about 21 years ago. Her smoking use included cigarettes. She has a 0.80 pack-year smoking history. She has never used smokeless tobacco. She reports that she does not drink alcohol or use drugs.  No Known Allergies  Family History  Problem Relation Age of Onset  . Cancer Brother   . Stroke Daughter     Prior to Admission medications   Medication Sig Start Date End Date Taking? Authorizing Provider  acetaminophen (TYLENOL) 500 MG tablet Take 500-1,000 mg by mouth every 6 (six) hours as needed.   Yes [provider]    Physical Exam: Vitals:   11/05/18 1541 11/05/18 1542 11/05/18 1820 11/05/18 1830  BP: 104/77  (!) 168/71 (!) 143/77  Pulse: 91  90 96  Resp: 15  16 16   Temp: 97.7 F (36.5 C)  TempSrc: Oral     SpO2:  97% 100% 100%  Weight:        Constitutional: NAD, calm, comfortable Vitals:   11/05/18 1541 11/05/18 1542 11/05/18 1820 11/05/18 1830  BP: 104/77  (!) 168/71 (!) 143/77  Pulse: 91  90 96  Resp: 15  16 16   Temp: 97.7 F (36.5 C)     TempSrc: Oral     SpO2:  97% 100% 100%  Weight:       Eyes: PERRL, lids and conjunctivae normal, thin and cachectic in appearance ENMT: Mucous membranes are dry. Posterior pharynx  clear of any exudate or lesions.Normal dentition.  Neck: normal, supple, no masses, no thyromegaly Respiratory: clear to auscultation bilaterally, no wheezing, no crackles. Normal respiratory effort. No accessory muscle use.  Cardiovascular: Regular rate and rhythm, no murmurs / rubs / gallops. No extremity edema. 2+ pedal pulses. No carotid bruits.  Abdomen: Scaphoid abdomen, no tenderness, no masses palpated. No hepatosplenomegaly. Bowel sounds positive.  Musculoskeletal: no clubbing / cyanosis. No joint deformity upper and lower extremities. Good ROM, no contractures.  Decreased muscle tone Skin: no rashes, lesions, ulcers. No induration Neurologic: CN 2-12 grossly intact. Sensation intact, DTR normal. Strength 5/5 in all 4.  Psychiatric: Normal judgment and insight. Alert and oriented x 3. Normal mood.    Labs on Admission: I have personally reviewed following labs and imaging studies  CBC: Recent Labs  Lab 11/05/18 1509  WBC 11.3*  NEUTROABS 10.1*  HGB 7.1*  HCT 23.9*  MCV 95.2  PLT 295*   Basic Metabolic Panel: Recent Labs  Lab 11/05/18 1509  NA 139  K 5.4*  CL 105  CO2 22  GLUCOSE 123*  BUN 26*  CREATININE 1.30*  CALCIUM 9.2   GFR: Estimated Creatinine Clearance: 18.1 mL/min (A) (by C-G formula based on SCr of 1.3 mg/dL (H)). Liver Function Tests: Recent Labs  Lab 11/05/18 1509  AST 16  ALT 13  ALKPHOS 103  BILITOT 0.7  PROT 7.3  ALBUMIN 2.7*   No results for input(s): LIPASE, AMYLASE in the last 168 hours. No results for input(s): AMMONIA in the last 168 hours. Coagulation Profile: No results for input(s): INR, PROTIME in the last 168 hours. Cardiac Enzymes: No results for input(s): CKTOTAL, CKMB, CKMBINDEX, TROPONINI in the last 168 hours. BNP (last 3 results) No results for input(s): PROBNP in the last 8760 hours. HbA1C: No results for input(s): HGBA1C in the last 72 hours. CBG: No results for input(s): GLUCAP in the last 168 hours. Lipid  Profile: No results for input(s): CHOL, HDL, LDLCALC, TRIG, CHOLHDL, LDLDIRECT in the last 72 hours. Thyroid Function Tests: No results for input(s): TSH, T4TOTAL, FREET4, T3FREE, THYROIDAB in the last 72 hours. Anemia Panel: Recent Labs    11/05/18 1509 11/05/18 1703  RETICCTPCT 1.1 1.1   Urine analysis:    Component Value Date/Time   COLORURINE STRAW (A) 10/24/2018 1217   APPEARANCEUR CLEAR 10/24/2018 1217   LABSPEC 1.026 10/24/2018 1217   PHURINE 5.0 10/24/2018 1217   GLUCOSEU NEGATIVE 10/24/2018 1217   [place] 10/24/2018 1217   [place] 10/24/2018 1217   [place] 10/24/2018 1217   PROTEINUR 30 (A) 10/24/2018 1217   NITRITE NEGATIVE 10/24/2018 1217   LEUKOCYTESUR NEGATIVE 10/24/2018 1217    Radiological Exams on Admission: No results found.   Assessment/Plan Principal Problem:   Symptomatic anemia Active Problems:   Weight loss, unintentional   Thrombocythemia (HCC)   Chronic back pain   Acute renal  failure superimposed on stage 3 chronic kidney disease (HCC)  Symptomatic anemia Patient presenting with progressive weakness and dizziness associated with nausea.  No previous history of EGD/colonoscopy.  No reported hematemesis or blood in her stool.  Hemoglobin is 7.1 with MCV of 95.2.  Review of previous anemia work-up notable for iron level 7, TIBC low at 145, normal ferritin at 363, B12 normal at 492 and folate normal at 17.6. --Place in observation --Pending transfusion ordered by EDP 1 unit --Repeat iron studies --Check TSH and INR --FOBT x3 --Oncology/hematology consultation for further recommendations --May benefit from IV iron infusion --Not optimal candidate for EGD/colonoscopy given her advanced age --Repeat CBC in a.m. --Supportive care  Acute on chronic kidney disease stage III Most recent creatinine 1.07 on 10/24/2018, baseline 1.46 -1.9.  Patient with poor oral intake and significant weight loss.   --Check urine sodium  and creatinine. --IV fluid hydration --Avoid nephrotoxins, renally dose all medications --Repeat BMP in a.m.  Unintentional weight loss Weakness/debility 1 cm nodular opacity right middle lobe Reports weight 2 years ago was 140 pounds, now down to 80 pounds.  Patient with poor oral intake.  Notable 1 cm nodular opacity right middle lobe on CT abdomen/pelvis on 10/24/2018.  Previous history of significant tobacco abuse. --Check CT chest with IV contrast per oncology and radiology recommendations --PT/OT evaluation --Speech therapy evaluation --Nutrition consult --Social work for assistance as patient will likely need placement whether SNF versus ALF  Elevated blood pressure Blood pressure noted to be 168/71 on admission.  Not on any antihypertensive regimen. --Continue to monitor BP closely --If BP trends up or consistently high, will need to consider antihypertensive therapy.  Chronic low back pain --Continue Tylenol as needed  Thrombocythemia Platelets 705 on admission.  Unclear cause. --Repeat CBC in the a.m.  DVT prophylaxis: Lovenox Code Status: Full code Family Communication: Discussed with daughter, Joycelyn Schmid who is at bedside Disposition Plan: To be determined, lives alone, likely will need SNF versus ALF Consults called: Oncology, order placed Admission status: Observation   Zaelyn Noack J [place] DO [facility] Pager [phone]  If 7PM-7AM, please contact night-coverage [email or web address] Password [facility]  11/05/2018, 7:18 PM

## 2018-11-06 DIAGNOSIS — N183 Chronic kidney disease, stage 3 unspecified: Secondary | ICD-10-CM

## 2018-11-06 DIAGNOSIS — R627 Adult failure to thrive: Secondary | ICD-10-CM | POA: Diagnosis not present

## 2018-11-06 DIAGNOSIS — Z515 Encounter for palliative care: Secondary | ICD-10-CM

## 2018-11-06 DIAGNOSIS — D500 Iron deficiency anemia secondary to blood loss (chronic): Secondary | ICD-10-CM | POA: Diagnosis not present

## 2018-11-06 DIAGNOSIS — D473 Essential (hemorrhagic) thrombocythemia: Secondary | ICD-10-CM | POA: Diagnosis not present

## 2018-11-06 DIAGNOSIS — R918 Other nonspecific abnormal finding of lung field: Secondary | ICD-10-CM | POA: Diagnosis not present

## 2018-11-06 DIAGNOSIS — Z7189 Other specified counseling: Secondary | ICD-10-CM

## 2018-11-06 DIAGNOSIS — D649 Anemia, unspecified: Secondary | ICD-10-CM

## 2018-11-06 DIAGNOSIS — R634 Abnormal weight loss: Secondary | ICD-10-CM

## 2018-11-06 DIAGNOSIS — I471 Supraventricular tachycardia: Secondary | ICD-10-CM | POA: Diagnosis not present

## 2018-11-06 DIAGNOSIS — C3411 Malignant neoplasm of upper lobe, right bronchus or lung: Secondary | ICD-10-CM

## 2018-11-06 DIAGNOSIS — E43 Unspecified severe protein-calorie malnutrition: Secondary | ICD-10-CM | POA: Diagnosis not present

## 2018-11-06 LAB — TYPE AND SCREEN
ABO/RH(D): A POS
Antibody Screen: NEGATIVE
Unit division: 0

## 2018-11-06 LAB — MAGNESIUM: Magnesium: 2.1 mg/dL (ref 1.7–2.4)

## 2018-11-06 LAB — T4, FREE: Free T4: 0.81 ng/dL (ref 0.61–1.12)

## 2018-11-06 LAB — BASIC METABOLIC PANEL
Anion gap: 8 (ref 5–15)
BUN: 27 mg/dL — ABNORMAL HIGH (ref 8–23)
CO2: 22 mmol/L (ref 22–32)
Calcium: 8.2 mg/dL — ABNORMAL LOW (ref 8.9–10.3)
Chloride: 107 mmol/L (ref 98–111)
Creatinine, Ser: 1.21 mg/dL — ABNORMAL HIGH (ref 0.44–1.00)
GFR calc Af Amer: 47 mL/min — ABNORMAL LOW (ref >60)
GFR calc non Af Amer: 41 mL/min — ABNORMAL LOW (ref >60)
Glucose, Bld: 82 mg/dL (ref 70–99)
Potassium: 4.8 mmol/L (ref 3.5–5.1)
Sodium: 137 mmol/L (ref 135–145)

## 2018-11-06 LAB — CBC
HCT: 28.7 % — ABNORMAL LOW (ref 36.0–46.0)
Hemoglobin: 8.9 g/dL — ABNORMAL LOW (ref 12.0–15.0)
MCH: 28.1 pg (ref 26.0–34.0)
MCHC: 31 g/dL (ref 30.0–36.0)
MCV: 90.5 fL (ref 80.0–100.0)
Platelets: 629 10*3/uL — ABNORMAL HIGH (ref 150–400)
RBC: 3.17 MIL/uL — ABNORMAL LOW (ref 3.87–5.11)
RDW: 16 % — ABNORMAL HIGH (ref 11.5–15.5)
WBC: 12.1 10*3/uL — ABNORMAL HIGH (ref 4.0–10.5)
nRBC: 0 % (ref 0.0–0.2)

## 2018-11-06 LAB — BPAM RBC
Blood Product Expiration Date: 202009122359
ISSUE DATE / TIME: 202009092327
Unit Type and Rh: 600

## 2018-11-06 LAB — OCCULT BLOOD X 1 CARD TO LAB, STOOL: Fecal Occult Bld: NEGATIVE

## 2018-11-06 LAB — PHOSPHORUS: Phosphorus: 3.8 mg/dL (ref 2.5–4.6)

## 2018-11-06 MED ORDER — SODIUM CHLORIDE 0.9 % IV SOLN
125.0000 mg | Freq: Once | INTRAVENOUS | Status: AC
  Administered 2018-11-06: 125 mg via INTRAVENOUS
  Filled 2018-11-06: qty 10

## 2018-11-06 MED ORDER — ENSURE ENLIVE PO LIQD
237.0000 mL | Freq: Two times a day (BID) | ORAL | Status: DC
  Administered 2018-11-07 – 2018-11-08 (×3): 237 mL via ORAL

## 2018-11-06 MED ORDER — ADULT MULTIVITAMIN W/MINERALS CH
1.0000 | ORAL_TABLET | Freq: Every day | ORAL | Status: DC
  Administered 2018-11-07 – 2018-11-08 (×2): 1 via ORAL
  Filled 2018-11-06 (×2): qty 1

## 2018-11-06 MED ORDER — ENSURE ENLIVE PO LIQD: 237.0000 mL | Freq: Two times a day (BID) | ORAL | Status: DC

## 2018-11-06 NOTE — Consult Note (Signed)
Consult requested by: [facility] hospitalist, Dr. Carles Collet Consult requested for: Abnormal chest CT  HPI: This is an 85 year old who was admitted with weakness and dizziness.  She has history of chronic back pain chronic kidney disease stage III.  She has had an approximately 50 pound weight loss over the last 2 years of unclear etiology.  She has an approximately 30-pack-year smoking history but stopped 27 years ago.  She has been living alone but she is getting increasingly weak.  She had been seen by oncology earlier on the day of admission and work-up was underway.  She had CT of the abdomen and pelvis done on 10/24/2018 and that showed that she had some nonspecific free fluid in her pelvis and also had a nodular opacity in her right lung.  She is not had vomiting diarrhea no blood in her stool no cough or congestion no fever or chills.  No hemoptysis.  Because of a nodule seen on her lung on the CT abdomen and pelvis she had CT of the chest with contrast and that shows an anterior right middle lobe nodule and bulky right hilar adenopathy and mediastinal adenopathy suggestive of lung cancer.  There is also vague groundglass appearance in the posterior right upper lobe.  Past Medical History:  Diagnosis Date  . Anemia   . Anxiety   . Arthritis   . High cholesterol   . History of shingles      Family History  Problem Relation Age of Onset  . Cancer Brother   . Stroke Daughter      Social History   Socioeconomic History  . Marital status: Widowed    Spouse name: Not on file  . Number of children: 4  . Years of education: Not on file  . Highest education level: Not on file  Occupational History  . Occupation: retired  Scientific laboratory technician  . Financial resource strain: Not on file  . Food insecurity    Worry: Not on file    Inability: Not on file  . Transportation needs    Medical: Not on file    Non-medical: Not on file  Tobacco Use  . Smoking status: Former Smoker    Packs/day: 0.02     Years: 40.00    Pack years: 0.80    Types: Cigarettes    Quit date: 02/26/1997    Years since quitting: 21.7  . Smokeless tobacco: Never Used  Substance and Sexual Activity  . Alcohol use: No  . Drug use: No  . Sexual activity: Not Currently    Birth control/protection: None  Lifestyle  . Physical activity    Days per week: Not on file    Minutes per session: Not on file  . Stress: Not on file  Relationships  . Social Herbalist on phone: Not on file    Gets together: Not on file    Attends religious service: Not on file    Active member of club or organization: Not on file    Attends meetings of clubs or organizations: Not on file    Relationship status: Not on file  Other Topics Concern  . Not on file  Social History Narrative  . Not on file     ROS: Except as mentioned 10 point review of systems is negative    Objective: Vital signs in last 24 hours: Temp:  [97.3 F (36.3 C)-98.9 F (37.2 C)] 98.9 F (37.2 C) (09/10 0604) Pulse Rate:  [88-118] 88 (09/10  7510) Resp:  [15-31] 16 (09/10 0604) BP: (103-168)/(49-79) 146/69 (09/10 0604) SpO2:  [97 %-100 %] 99 % (09/10 0604) Weight:  [33.6 kg-36.3 kg] 33.6 kg (09/09 2107) Weight change:  Last BM Date: 11/05/18  Intake/Output from previous day: 09/09 0701 - 09/10 0700 In: 1239.9 [P.O.:240; I.V.:56.3; Blood:442; IV Piggyback:501.6] Out: -   PHYSICAL EXAM Constitutional: She is very thin.  She is awake and alert.  Eyes: Pupils react EOMI.  Ears nose mouth and throat: Her mucous membranes are moist.  Neck is supple.  Cardiovascular: Her heart is regular with normal heart sounds.  Respiratory: Respiratory effort is normal and her lungs are clear.  Gastrointestinal: Her abdomen is soft with no masses.  Musculoskeletal: She is generally weak.  Neurological: No focal abnormalities.  Psychiatric: Normal mood and affect  Lab Results: Basic Metabolic Panel: Recent Labs    11/05/18 1509 11/06/18 0458  NA 139 137   K 5.4* 4.8  CL 105 107  CO2 22 22  GLUCOSE 123* 82  BUN 26* 27*  CREATININE 1.30* 1.21*  CALCIUM 9.2 8.2*  MG  --  2.1  PHOS  --  3.8   Liver Function Tests: Recent Labs    11/05/18 1509  AST 16  ALT 13  ALKPHOS 103  BILITOT 0.7  PROT 7.3  ALBUMIN 2.7*   No results for input(s): LIPASE, AMYLASE in the last 72 hours. No results for input(s): AMMONIA in the last 72 hours. CBC: Recent Labs    11/05/18 1509 11/06/18 0458  WBC 11.3* 12.1*  NEUTROABS 10.1*  --   HGB 7.1* 8.9*  HCT 23.9* 28.7*  MCV 95.2 90.5  PLT 705* 629*   Cardiac Enzymes: No results for input(s): CKTOTAL, CKMB, CKMBINDEX, TROPONINI in the last 72 hours. BNP: No results for input(s): PROBNP in the last 72 hours. D-Dimer: No results for input(s): DDIMER in the last 72 hours. CBG: No results for input(s): GLUCAP in the last 72 hours. Hemoglobin A1C: No results for input(s): HGBA1C in the last 72 hours. Fasting Lipid Panel: No results for input(s): CHOL, HDL, LDLCALC, TRIG, CHOLHDL, LDLDIRECT in the last 72 hours. Thyroid Function Tests: Recent Labs    11/05/18 1509  TSH 8.681*   Anemia Panel: Recent Labs    11/05/18 1703 11/05/18 1814  VITAMINB12  --  586  FOLATE 19.1  --   FERRITIN  --  490*  TIBC  --  156*  IRON  --  7*  RETICCTPCT 1.1  --    Coagulation: Recent Labs    11/05/18 1509  LABPROT 13.9  INR 1.1   Urine Drug Screen: Drugs of Abuse  No results found for: LABOPIA, COCAINSCRNUR, LABBENZ, AMPHETMU, THCU, LABBARB  Alcohol Level: No results for input(s): ETH in the last 72 hours. Urinalysis: No results for input(s): COLORURINE, LABSPEC, PHURINE, GLUCOSEU, HGBUR, BILIRUBINUR, KETONESUR, PROTEINUR, UROBILINOGEN, NITRITE, LEUKOCYTESUR in the last 72 hours.  Invalid input(s): APPERANCEUR Misc. Labs:   ABGS: No results for input(s): PHART, PO2ART, TCO2, HCO3 in the last 72 hours.  Invalid input(s): PCO2   MICROBIOLOGY: Recent Results (from the past 240 hour(s))   SARS Coronavirus 2 [facility] order, Performed in [facility] hospital lab) Nasopharyngeal Nasopharyngeal Swab     Status: None   Collection Time: 11/05/18  6:18 PM   Specimen: Nasopharyngeal Swab  Result Value Ref Range Status   SARS Coronavirus 2 NEGATIVE NEGATIVE Final    Comment: (NOTE) If result is NEGATIVE SARS-CoV-2 target nucleic acids are NOT DETECTED. The  SARS-CoV-2 RNA is generally detectable in upper and lower  respiratory specimens during the acute phase of infection. The lowest  concentration of SARS-CoV-2 viral copies this assay can detect is 250  copies / mL. A negative result does not preclude SARS-CoV-2 infection  and should not be used as the sole basis for treatment or other  patient management decisions.  A negative result may occur with  improper specimen collection / handling, submission of specimen other  than nasopharyngeal swab, presence of viral mutation(s) within the  areas targeted by this assay, and inadequate number of viral copies  (<250 copies / mL). A negative result must be combined with clinical  observations, patient history, and epidemiological information. If result is POSITIVE SARS-CoV-2 target nucleic acids are DETECTED. The SARS-CoV-2 RNA is generally detectable in upper and lower  respiratory specimens dur ing the acute phase of infection.  Positive  results are indicative of active infection with SARS-CoV-2.  Clinical  correlation with patient history and other diagnostic information is  necessary to determine patient infection status.  Positive results do  not rule out bacterial infection or co-infection with other viruses. If result is PRESUMPTIVE POSTIVE SARS-CoV-2 nucleic acids MAY BE PRESENT.   A presumptive positive result was obtained on the submitted specimen  and confirmed on repeat testing.  While 2019 novel coronavirus  (SARS-CoV-2) nucleic acids may be present in the submitted sample  additional confirmatory testing may be  necessary for epidemiological  and / or clinical management purposes  to differentiate between  SARS-CoV-2 and other Sarbecovirus currently known to infect humans.  If clinically indicated additional testing with an alternate test  methodology [phone]) is advised. The SARS-CoV-2 RNA is generally  detectable in upper and lower respiratory sp ecimens during the acute  phase of infection. The expected result is Negative. Fact Sheet for Patients:  StrictlyIdeas.no Fact Sheet for Healthcare Providers: BankingDealers.co.za This test is not yet approved or cleared by the [place] FDA and has been authorized for detection and/or diagnosis of SARS-CoV-2 by FDA under an Emergency Use Authorization (EUA).  This EUA will remain in effect (meaning this test can be used) for the duration of the COVID-19 declaration under Section 564(b)(1) of the Act, 21 U.S.C. section 360bbb-3(b)(1), unless the authorization is terminated or revoked sooner. Performed at [facility] [address] [place], [place] [address]     Studies/Results: Ct Chest W Contrast  Result Date: 11/05/2018 CLINICAL DATA:  Lung nodule noted on CT abdomen. EXAM: CT CHEST WITH CONTRAST TECHNIQUE: Multidetector CT imaging of the chest was performed during intravenous contrast administration. CONTRAST:  42mL OMNIPAQUE IOHEXOL 300 MG/ML  SOLN COMPARISON:  CT abdomen 10/24/2018 FINDINGS: Cardiovascular: Diffuse aortic and coronary artery atherosclerosis. Heart is normal size. Aorta is normal caliber. Mediastinum/Nodes: Bulky right hilar adenopathy. Index posterior right hilar lymph node has a short axis diameter of 2 cm. Other similarly sized right hilar lymph nodes. Right paratracheal adenopathy with index right paratracheal lymph node having a short axis diameter of 13 mm. No axillary adenopathy. Subcarinal adenopathy noted. Lungs/Pleura: Anterior right middle lobe nodule measures 13 mm in  greatest diameter. Vague ground-glass area posteriorly in the right upper lobe. Left lung clear. No effusions. Upper Abdomen: Diffuse low-density throughout the liver, likely fatty infiltration. Cysts in the kidneys bilaterally with cortical thinning/scarring. Musculoskeletal: Chest wall soft tissues are unremarkable. No acute bony abnormality. IMPRESSION: 13 mm anterior right middle lobe nodule. Bulky right hilar adenopathy. Mediastinal adenopathy. Findings most suggestive of lung cancer.  Vague ground-glass area in the posterior right upper lobe. This could reflect an area of inflammation although low-grade adenocarcinoma can have this appearance. Recommend attention on follow-up imaging. Coronary artery disease. Aortic Atherosclerosis (ICD10-I70.0). Electronically Signed   By: Rolm Baptise M.D.   On: 11/05/2018 21:56    Medications:  Prior to Admission:  Medications Prior to Admission  Medication Sig Dispense Refill Last Dose  . acetaminophen (TYLENOL) 500 MG tablet Take 500-1,000 mg by mouth every 6 (six) hours as needed.   11/01/2018 at Unknown time   Scheduled: . enoxaparin (LOVENOX) injection  30 mg Subcutaneous Q24H  . influenza vaccine adjuvanted  0.5 mL Intramuscular Tomorrow-1000  . pneumococcal 23 valent vaccine  0.5 mL Intramuscular Tomorrow-1000   Continuous: . sodium chloride 75 mL/hr at 11/06/18 0313   VPC:HEKBTCYELYHTM **OR** acetaminophen, ondansetron **OR** ondansetron (ZOFRAN) IV, senna-docusate  Assesment: She was admitted with symptomatic anemia and weight loss which is unintentional.  She has acute on chronic stage III kidney disease.  She has abnormal chest CT with a right middle lobe nodule bulky right hilar adenopathy and mediastinal adenopathy.  This is highly suggestive of lung cancer.  My review of the CT it does not appear that any of the adenopathy has broken through the bronchus so I do not think it will be accessible by bronchoscopy here.  She will probably need  some other procedure that could likely be done as an outpatient when she feels better.  She may need mediastinoscopy or needle biopsy of adenopathy via bronchoscopy but we cannot perform either of those here. Principal Problem:   Symptomatic anemia Active Problems:   Weight loss, unintentional   Thrombocythemia (HCC)   Chronic back pain   Acute renal failure superimposed on stage 3 chronic kidney disease ([place])    Plan: As above.  Thanks for allowing me to see her with you    LOS: 0 days   Alonza Bogus 11/06/2018, 9:01 AM

## 2018-11-06 NOTE — Progress Notes (Signed)
SLP Cancellation Note  Patient Details Name: LORRINE KILLILEA MRN: [id number] DOB: 1931-07-05   Cancelled treatment:       Reason Eval/Treat Not Completed: (SLP unable to see this date due to schedule conflict); SLP will check tomorrow.  Thank you,  Genene Churn, [place]    [place] 11/06/2018, 6:11 PM

## 2018-11-06 NOTE — Progress Notes (Signed)
PROGRESS NOTE  Alison Roberson WRU:[id number] DOB: 04-04-1931 DOA: 11/05/2018 PCP: Neale Burly, MD  Brief History:  85 year old female with a history of CKD stage III, chronic back pain, hyperlipidemia presenting with 1 to 32-month history of progressive generalized weakness, intermittent dizziness, and decreased oral intake.  The patient has had some intermittent nausea with occasional loose stools.  She denied any hematochezia, melena, fevers, chills, chest pain, shortness of breath, hematemesis, dysuria, hematuria.  The patient lives by herself and has been resistant to see her physicians.  The patient is able to manage her ADLs independently.  She states that she does not use any assistive devices.  The patient was seen by Dr. Delton Coombes in the emergency department due to the above symptoms.  He ordered LDH, reticulocyte count, SPEP, copper level, and methylmalonic acid levels.  CT of the abdomen and pelvis on 10/24/2018 showed a small amount nonspecific free fluid in the pelvis with intermittent origin.  There was also indication of a 1 cm opacity in the right middle lobe that was incompletely characterized.  CT of the chest on 11/06/2018 showed bulky mediastinal, right hilar, and paratracheal lymphadenopathy with a vague groundglass opacity in right upper lobe.  Pulmonary medicine was consulted to assist with management.  Dr. Delton Coombes was also reconsulted.  Assessment/Plan: Symptomatic anemia -Presented with hemoglobin 7.1 -Patient had hemoglobin 10.3 on 08/09/2017 -Stool for occult blood was negative in the emergency department -Patient has never had a colonoscopy -She reportedly received a blood transfusion when she was hospitalized in [place] 2 months ago -Repeat FOBT -Iron saturation 5%, ferritin 914, N82 956, folic acid 21.3 -Give Nulecit x 1  Failure to thrive -TSH 3.681 -Check free T4 -PT evaluation  Mediastinal and hilar lymphadenopathy -Appreciate pulmonary  consult--> will ultimately need mediastinoscopy -Discussed with the patient concern for possible lung cancer or other malignancy -Currently refuses any type of invasive procedures or biopsy -I have spoken extensively with the patient as well as her daughter -Certainly, this can be followed up as an outpatient and mediastinoscopy can be set up as an outpatient if the patient continues to refuse invasive intervention at this time -Reconsult Dr. Delton Coombes  CKD stage III -Baseline creatinine 1.0-1.2  Elevated blood pressure -Monitor clinically for now  Thrombocytosis -There is a component of iron deficiency contributing to this  Chronic low back pain -Patient states that this is stable and actually a little bit better -PT evaluation -She takes acetaminophen for at home which will be continued       Disposition Plan:   Home 9/11 if stable Family Communication:   Daughter updated 9/10  Consultants:  Med/onc and pulmonary and palliative  Code Status:  FULL  DVT Prophylaxis:  [place] Lovenox   Procedures: As Listed in Progress Note Above  Antibiotics: None   Total time spent 35 minutes.  Greater than 50% spent face to face counseling and coordinating care.     Subjective: The patient complains of generalized weakness.  She denies fevers, chills, headache, chest pain, shortness of breath, coughing, hemoptysis, vomiting, diarrhea, abdominal pain, dysuria, hematochezia, melena.  Objective: Vitals:   11/05/18 2349 11/05/18 2354 11/06/18 0315 11/06/18 0604  BP: (!) 136/56 (!) 136/56 (!) 126/49 (!) 146/69  Pulse: 98 98 94 88  Resp: 16 16 16 16   Temp: 97.7 F (36.5 C) 97.7 F (36.5 C) 98.7 F (37.1 C) 98.9 F (37.2 C)  TempSrc:  Oral Oral Oral  SpO2:  97% 100% 99%  Weight:      Height:        Intake/Output Summary (Last 24 hours) at 11/06/2018 1005 Last data filed at 11/06/2018 0400 Gross per 24 hour  Intake 1239.94 ml  Output --  Net 1239.94 ml   Weight change:   Exam:   General:  Pt is alert, follows commands appropriately, not in acute distress  HEENT: No icterus, No thrush, No neck mass, [place]/AT  Cardiovascular: RRR, S1/S2, no rubs, no gallops  Respiratory: Diminished breath sounds at the bases.  Bibasilar rales but no wheezing.  Good air movement.  Abdomen: Soft/+BS, non tender, non distended, no guarding  Extremities: No edema, No lymphangitis, No petechiae, No rashes, no synovitis   Data Reviewed: I have personally reviewed following labs and imaging studies Basic Metabolic Panel: Recent Labs  Lab 11/05/18 1509 11/06/18 0458  NA 139 137  K 5.4* 4.8  CL 105 107  CO2 22 22  GLUCOSE 123* 82  BUN 26* 27*  CREATININE 1.30* 1.21*  CALCIUM 9.2 8.2*  MG  --  2.1  PHOS  --  3.8   Liver Function Tests: Recent Labs  Lab 11/05/18 1509  AST 16  ALT 13  ALKPHOS 103  BILITOT 0.7  PROT 7.3  ALBUMIN 2.7*   No results for input(s): LIPASE, AMYLASE in the last 168 hours. No results for input(s): AMMONIA in the last 168 hours. Coagulation Profile: Recent Labs  Lab 11/05/18 1509  INR 1.1   CBC: Recent Labs  Lab 11/05/18 1509 11/06/18 0458  WBC 11.3* 12.1*  NEUTROABS 10.1*  --   HGB 7.1* 8.9*  HCT 23.9* 28.7*  MCV 95.2 90.5  PLT 705* 629*   Cardiac Enzymes: No results for input(s): CKTOTAL, CKMB, CKMBINDEX, TROPONINI in the last 168 hours. BNP: Invalid input(s): POCBNP CBG: No results for input(s): GLUCAP in the last 168 hours. HbA1C: No results for input(s): HGBA1C in the last 72 hours. Urine analysis:    Component Value Date/Time   COLORURINE STRAW (A) 10/24/2018 1217   APPEARANCEUR CLEAR 10/24/2018 1217   LABSPEC 1.026 10/24/2018 1217   PHURINE 5.0 10/24/2018 1217   GLUCOSEU NEGATIVE 10/24/2018 1217   [place] 10/24/2018 1217   [place] 10/24/2018 1217   [place] 10/24/2018 1217   PROTEINUR 30 (A) 10/24/2018 1217   NITRITE NEGATIVE 10/24/2018 1217   LEUKOCYTESUR NEGATIVE  10/24/2018 1217   Sepsis Labs: @LABRCNTIP (procalcitonin:4,lacticidven:4) ) Recent Results (from the past 240 hour(s))  SARS Coronavirus 2 [facility] order, Performed in [facility] hospital lab) Nasopharyngeal Nasopharyngeal Swab     Status: None   Collection Time: 11/05/18  6:18 PM   Specimen: Nasopharyngeal Swab  Result Value Ref Range Status   SARS Coronavirus 2 NEGATIVE NEGATIVE Final    Comment: (NOTE) If result is NEGATIVE SARS-CoV-2 target nucleic acids are NOT DETECTED. The SARS-CoV-2 RNA is generally detectable in upper and lower  respiratory specimens during the acute phase of infection. The lowest  concentration of SARS-CoV-2 viral copies this assay can detect is 250  copies / mL. A negative result does not preclude SARS-CoV-2 infection  and should not be used as the sole basis for treatment or other  patient management decisions.  A negative result may occur with  improper specimen collection / handling, submission of specimen other  than nasopharyngeal swab, presence of viral mutation(s) within the  areas targeted by this assay, and inadequate number of viral copies  (<250 copies / mL). A negative result  must be combined with clinical  observations, patient history, and epidemiological information. If result is POSITIVE SARS-CoV-2 target nucleic acids are DETECTED. The SARS-CoV-2 RNA is generally detectable in upper and lower  respiratory specimens dur ing the acute phase of infection.  Positive  results are indicative of active infection with SARS-CoV-2.  Clinical  correlation with patient history and other diagnostic information is  necessary to determine patient infection status.  Positive results do  not rule out bacterial infection or co-infection with other viruses. If result is PRESUMPTIVE POSTIVE SARS-CoV-2 nucleic acids MAY BE PRESENT.   A presumptive positive result was obtained on the submitted specimen  and confirmed on repeat testing.  While 2019 novel  coronavirus  (SARS-CoV-2) nucleic acids may be present in the submitted sample  additional confirmatory testing may be necessary for epidemiological  and / or clinical management purposes  to differentiate between  SARS-CoV-2 and other Sarbecovirus currently known to infect humans.  If clinically indicated additional testing with an alternate test  methodology [phone]) is advised. The SARS-CoV-2 RNA is generally  detectable in upper and lower respiratory sp ecimens during the acute  phase of infection. The expected result is Negative. Fact Sheet for Patients:  StrictlyIdeas.no Fact Sheet for Healthcare Providers: BankingDealers.co.za This test is not yet approved or cleared by the [place] FDA and has been authorized for detection and/or diagnosis of SARS-CoV-2 by FDA under an Emergency Use Authorization (EUA).  This EUA will remain in effect (meaning this test can be used) for the duration of the COVID-19 declaration under Section 564(b)(1) of the Act, 21 U.S.C. section 360bbb-3(b)(1), unless the authorization is terminated or revoked sooner. Performed at [facility], [address], [place], [place] [address]      Scheduled Meds:  enoxaparin (LOVENOX) injection  30 mg Subcutaneous Q24H   influenza vaccine adjuvanted  0.5 mL Intramuscular Tomorrow-1000   pneumococcal 23 valent vaccine  0.5 mL Intramuscular Tomorrow-1000   Continuous Infusions:  sodium chloride 75 mL/hr at 11/06/18 8119    Procedures/Studies: Ct Chest W Contrast  Result Date: 11/05/2018 CLINICAL DATA:  Lung nodule noted on CT abdomen. EXAM: CT CHEST WITH CONTRAST TECHNIQUE: Multidetector CT imaging of the chest was performed during intravenous contrast administration. CONTRAST:  46mL OMNIPAQUE IOHEXOL 300 MG/ML  SOLN COMPARISON:  CT abdomen 10/24/2018 FINDINGS: Cardiovascular: Diffuse aortic and coronary artery atherosclerosis. Heart is normal size. Aorta is  normal caliber. Mediastinum/Nodes: Bulky right hilar adenopathy. Index posterior right hilar lymph node has a short axis diameter of 2 cm. Other similarly sized right hilar lymph nodes. Right paratracheal adenopathy with index right paratracheal lymph node having a short axis diameter of 13 mm. No axillary adenopathy. Subcarinal adenopathy noted. Lungs/Pleura: Anterior right middle lobe nodule measures 13 mm in greatest diameter. Vague ground-glass area posteriorly in the right upper lobe. Left lung clear. No effusions. Upper Abdomen: Diffuse low-density throughout the liver, likely fatty infiltration. Cysts in the kidneys bilaterally with cortical thinning/scarring. Musculoskeletal: Chest wall soft tissues are unremarkable. No acute bony abnormality. IMPRESSION: 13 mm anterior right middle lobe nodule. Bulky right hilar adenopathy. Mediastinal adenopathy. Findings most suggestive of lung cancer. Vague ground-glass area in the posterior right upper lobe. This could reflect an area of inflammation although low-grade adenocarcinoma can have this appearance. Recommend attention on follow-up imaging. Coronary artery disease. Aortic Atherosclerosis (ICD10-I70.0). Electronically Signed   By: Rolm Baptise M.D.   On: 11/05/2018 21:56   Ct Abdomen Pelvis W Contrast  Result Date: 10/24/2018 CLINICAL DATA:  85 year old female with acute diffuse abdominal and pelvic pain. EXAM: CT ABDOMEN AND PELVIS WITH CONTRAST TECHNIQUE: Multidetector CT imaging of the abdomen and pelvis was performed using the standard protocol following bolus administration of intravenous contrast. CONTRAST:  30mL OMNIPAQUE IOHEXOL 300 MG/ML  SOLN COMPARISON:  06/25/2015 CT and prior studies FINDINGS: Lower chest: On the 1st image, a 1 cm nodular opacity is identified within the RIGHT middle lobe. Hepatobiliary: The liver and gallbladder are unremarkable. No biliary dilatation. Pancreas: Unremarkable Spleen: Unremarkable Adrenals/Urinary Tract:  Bilateral renal cortical thinning and renal cysts are present. Renal vascular calcifications are noted. No evidence of hydronephrosis or obstructing urinary calculi. Stomach/Bowel: Stomach is within normal limits. The appendix is not definitely identified. No evidence of bowel wall thickening, distention, or inflammatory changes. Vascular/Lymphatic: Aortic atherosclerosis. No enlarged abdominal or pelvic lymph nodes. Reproductive: Uterus and bilateral adnexa are unremarkable. Other: A small amount of free fluid within the pelvis is nonspecific. No evidence of focal collection or pneumoperitoneum. Musculoskeletal: No acute or suspicious bony abnormalities. Degenerative changes within the lumbar spine identified. IMPRESSION: 1. Small amount of nonspecific free fluid within the pelvis - indeterminate origin. 2. Otherwise, no acute abnormality identified within the abdomen or pelvis. 3. 1 cm nodular opacity within the RIGHT middle lobe on the 1st image. This cannot be definitively characterized as atelectasis on this single images and chest CT with contrast is recommended for further evaluation. 4.  Aortic Atherosclerosis (ICD10-I70.0). Electronically Signed   By: Margarette Canada M.D.   On: 10/24/2018 14:10    Orson Eva, DO  [facility] Pager [phone]  If 7PM-7AM, please contact night-coverage [email or web address] Password [email or web address] 11/06/2018, 10:05 AM   LOS: 0 days

## 2018-11-06 NOTE — Evaluation (Signed)
Occupational Therapy Evaluation Patient Details Name: Alison Roberson MRN: [id number] DOB: [birth year] Today's Date: 11/06/2018    History of Present Illness Alison Roberson is a 85 y.o. female with medical history significant of chronic back pain, CKD stage III who presents with progressive weakness and dizziness.  Patient states that she has had progressive weakness and dizziness over the last several months.  There is some associated nausea, intermittent diarrhea.  Daughter reports that she used to weigh 140 pounds and now has had a roughly 50 pound weight loss over the past 2 years; of unclear etiology.  Patient with history of tobacco abuse, she reports that she quit 27 years ago.  Patient currently lives alone and manage her ADLs independently, although daughter is concerned that she is unable to maintain this lifestyle given her progressive decline.  Patient only takes Tylenol as needed for her chronic back pain.  She states complete a few small meals a day sparingly.  Patient denies any vomiting, hematemesis, and no blood in her stool.  No previous history of colonoscopy.  No other specific complaints at this time.   Clinical Impression   Pt agreeable to OT evaluation this am. Pt performing ADLs at Mod I to supervision level, managing IV pole during functional mobility tasks. Pt is at baseline with ADL completion. Pt does report interest in ALF, also has interest in getting help in her home. OT explained the difference in the two services and pt verbalized understanding. May benefit from ALF for safety.     Follow Up Recommendations  No OT follow up;Other (comment)(Pt reports interest in ALF)    Equipment Recommendations  Tub/shower seat       Precautions / Restrictions Precautions Precautions: Fall Restrictions Weight Bearing Restrictions: No      Mobility Bed Mobility Overal bed mobility: Modified Independent                Transfers Overall transfer level:  Modified independent Equipment used: None                      ADL either performed or assessed with clinical judgement   ADL Overall ADL's : Needs assistance/impaired     Grooming: Wash/dry hands;Supervision/safety;Standing Grooming Details (indicate cue type and reason): Pt completing grooming tasks at sink             Lower Body Dressing: Modified independent;Sitting/lateral leans Lower Body Dressing Details (indicate cue type and reason): pt donned shoes and tied laces while seated at EOB Toilet Transfer: Supervision/safety;Ambulation Toilet Transfer Details (indicate cue type and reason): Pt completing toileting while managing IV pole.  Toileting- Clothing Manipulation and Hygiene: Modified independent;Sitting/lateral lean;Sit to/from stand       Functional mobility during ADLs: Supervision/safety       Vision Baseline Vision/History: No visual deficits Patient Visual Report: No change from baseline Vision Assessment?: No apparent visual deficits            Pertinent Vitals/Pain Pain Assessment: No/denies pain     Hand Dominance Right   Extremity/Trunk Assessment Upper Extremity Assessment Upper Extremity Assessment: Overall WFL for tasks assessed   Lower Extremity Assessment Lower Extremity Assessment: Defer to PT evaluation   Cervical / Trunk Assessment Cervical / Trunk Assessment: Normal   Communication Communication Communication: No difficulties   Cognition Arousal/Alertness: Awake/alert Behavior During Therapy: WFL for tasks assessed/performed Overall Cognitive Status: Within Functional Limits for tasks assessed  Home Living Family/patient expects to be discharged to:: Private residence Living Arrangements: Alone Available Help at Discharge: Family;Available PRN/intermittently Type of Home: House Home Access: Ramped entrance     Home Layout: One level      Bathroom Shower/Tub: Teacher, early years/pre: Standard     Home Equipment: Cane - single point          Prior Functioning/Environment Level of Independence: Independent        Comments: Pt does not use DME, performs ADLs independently        OT Problem List: Decreased activity tolerance;Decreased safety awareness;Decreased knowledge of use of DME or AE       AM-PAC OT "6 Clicks" Daily Activity     Outcome Measure Help from another person eating meals?: None Help from another person taking care of personal grooming?: None Help from another person toileting, which includes using toliet, bedpan, or urinal?: None Help from another person bathing (including washing, rinsing, drying)?: A Little Help from another person to put on and taking off regular upper body clothing?: None Help from another person to put on and taking off regular lower body clothing?: None 6 Click Score: 23   End of Session    Activity Tolerance: Patient tolerated treatment well Patient left: in chair;with call bell/phone within reach  OT Visit Diagnosis: Muscle weakness (generalized) (M62.81)                Time: 7209-4709 OT Time Calculation (min): 25 min Charges:  OT General Charges $OT Visit: 1 Visit OT Evaluation $OT Eval Low Complexity: [place], OTR/L  [phone] 11/06/2018, 10:09 AM

## 2018-11-06 NOTE — Evaluation (Signed)
Physical Therapy Evaluation Patient Details Name: Alison Roberson MRN: [id number] DOB: 09/30/1931 Today's Date: 11/06/2018   History of Present Illness  Alison Roberson is a 85 y.o. female with medical history significant of chronic back pain, CKD stage III who presents with progressive weakness and dizziness.  Patient states that she has had progressive weakness and dizziness over the last several months.  There is some associated nausea, intermittent diarrhea.  Daughter reports that she used to weigh 140 pounds and now has had a roughly 50 pound weight loss over the past 2 years; of unclear etiology.  Patient with history of tobacco abuse, she reports that she quit 27 years ago.  Patient currently lives alone and manage her ADLs independently, although daughter is concerned that she is unable to maintain this lifestyle given her progressive decline.  Patient only takes Tylenol as needed for her chronic back pain.  She states complete a few small meals a day sparingly.  Patient denies any vomiting, hematemesis, and no blood in her stool.  No previous history of colonoscopy.  No other specific complaints at this time.    Clinical Impression  Patient functioning near baseline for functional mobility and gait other than having to lean on nearby objects for support during ambulation due to generalized weakness and instructed in use of SPC with good return demonstrated and understanding acknowledged.  Patient requested to go back to bed after therapy due to c/o fatigue.  Patient will benefit from continued physical therapy in hospital and recommended venue below to increase strength, balance, endurance for safe ADLs and gait.     Follow Up Recommendations Home health PT    Equipment Recommendations  Cane(single point cane)    Recommendations for Other Services       Precautions / Restrictions Precautions Precautions: Fall Restrictions Weight Bearing Restrictions: No      Mobility  Bed  Mobility Overal bed mobility: Modified Independent                Transfers Overall transfer level: Modified independent                  Ambulation/Gait Ambulation/Gait assistance: Supervision Gait Distance (Feet): 80 Feet Assistive device: None;IV Pole;Straight cane Gait Pattern/deviations: Decreased step length - right;Decreased step length - left;Decreased stride length Gait velocity: decreased   General Gait Details: slightly labored cadence with tendency to lean on nearby objects for support without AD, patient requested to push IV pole due generalized weakness, demonstrates good return for using SPC with 2 point gait pattern without loss of balance  Stairs            Wheelchair Mobility    Modified Rankin (Stroke Patients Only)       Balance Overall balance assessment: Needs assistance Sitting-balance support: Feet supported;No upper extremity supported Sitting balance-Leahy Scale: Good     Standing balance support: During functional activity;No upper extremity supported Standing balance-Leahy Scale: Fair Standing balance comment: fair/good using SPC                             Pertinent Vitals/Pain Pain Assessment: 0-10 Pain Score: 1  Pain Location: low back Pain Descriptors / Indicators: Aching Pain Intervention(s): Limited activity within patient's tolerance;Monitored during session    [place] expects to be discharged to:: Private residence Living Arrangements: Alone Available Help at Discharge: Family;Available PRN/intermittently Type of Home: House Home Access: Ramped entrance  Home Layout: One level Home Equipment: Walker - 2 wheels;Cane - quad Additional Comments: DME equipment left over from her spouse    Prior Function Level of Independence: Independent         Comments: household and short distanced community ambulator     Hand Dominance   Dominant Hand: Right    Extremity/Trunk  Assessment   Upper Extremity Assessment Upper Extremity Assessment: Defer to OT evaluation    Lower Extremity Assessment Lower Extremity Assessment: Generalized weakness    Cervical / Trunk Assessment Cervical / Trunk Assessment: Normal  Communication   Communication: No difficulties  Cognition Arousal/Alertness: Awake/alert Behavior During Therapy: WFL for tasks assessed/performed Overall Cognitive Status: Within Functional Limits for tasks assessed                                        General Comments      Exercises     Assessment/Plan    PT Assessment Patient needs continued PT services  PT Problem List Decreased strength;Decreased activity tolerance;Decreased balance;Decreased mobility       PT Treatment Interventions Balance training;Gait training;Stair training;Functional mobility training;Therapeutic activities;Therapeutic exercise;Patient/family education    PT Goals (Current goals can be found in the Care Plan section)  Acute Rehab PT Goals Patient Stated Goal: return home with family, neighbors to assist PT Goal Formulation: With patient Time For Goal Achievement: 11/10/18 Potential to Achieve Goals: Good    Frequency Min 2X/week   Barriers to discharge        Co-evaluation               AM-PAC PT "6 Clicks" Mobility  Outcome Measure Help needed turning from your back to your side while in a flat bed without using bedrails?: None Help needed moving from lying on your back to sitting on the side of a flat bed without using bedrails?: None Help needed moving to and from a bed to a chair (including a wheelchair)?: None Help needed standing up from a chair using your arms (e.g., wheelchair or bedside chair)?: None Help needed to walk in hospital room?: A Little Help needed climbing 3-5 steps with a railing? : A Little 6 Click Score: 22    End of Session   Activity Tolerance: Patient tolerated treatment well;Patient limited by  fatigue Patient left: in bed;with call bell/phone within reach Nurse Communication: Mobility status PT Visit Diagnosis: Unsteadiness on feet (R26.81);Other abnormalities of gait and mobility (R26.89);Muscle weakness (generalized) (M62.81)    Time: 0814-4818 PT Time Calculation (min) (ACUTE ONLY): 21 min   Charges:   PT Evaluation $PT Eval Moderate Complexity: 1 Mod PT Treatments $Therapeutic Activity: 8-22 mins        2:17 PM, 11/06/18 Lonell Grandchild, MPT Physical Therapist with [facility] [phone] office [phone] mobile phone

## 2018-11-06 NOTE — TOC Progression Note (Signed)
Transition of Care [facility]) - Progression Note    Patient Details  Name: Alison Roberson MRN: [id number] Date of Birth: 09/26/1931  Transition of Care [facility]) CM/SW Contact  Ihor Gully, LCSW Phone Number: 11/06/2018, 4:23 PM  Clinical Narrative:    Patient's daughter is agreeable to hospice in her home. Choices provided and referral made to [facility].  Cassandra at [facility] confirmed receipt of referral.    Expected Discharge Plan: [place] Barriers to Discharge: Continued Medical Work up  Expected Discharge Plan and Services Expected Discharge Plan: [place] arrangements for the past 2 months: Apartment Expected Discharge Date: 11/07/18                                     Social Determinants of Health (SDOH) Interventions    Readmission Risk Interventions No flowsheet data found.

## 2018-11-06 NOTE — Progress Notes (Signed)
Patient was educated on flu vaccine and pneumonia vaccine. Patient was indecisive whether she should take the vaccines. More education given and patient was told the importance of each vaccine and why she should take it. Patient states she is 85 years old and hasn't taken either prior so she doesn't want to start now.

## 2018-11-06 NOTE — Care Management Obs Status (Signed)
[facility] NOTIFICATION   Patient Details  Name: Alison Roberson MRN: [id number] Date of Birth: 07/22/1931   Medicare Observation Status Notification Given:  Yes    Ihor Gully, LCSW 11/06/2018, 3:21 PM

## 2018-11-06 NOTE — Consult Note (Signed)
[facility] Consultation Oncology  Name: Alison Roberson      MRN: [id number]    Location: A340/A340-01  Date: 11/06/2018 Time:3:49 PM   REFERRING PHYSICIAN: Dr. Carles Collet  REASON FOR CONSULT: Newly diagnosed lung mass and adenopathy in the chest   DIAGNOSIS: Highly likely stage III lung cancer.  HISTORY OF PRESENT ILLNESS: Alison Roberson is a 85 year old very pleasant African-American female seen in consultation today for further work-up and management of right lung nodule and mediastinal adenopathy.  She had a 55 pound weight loss in the last 1 to 2 years.  She has been getting progressively weaker.  She lives by herself at home.  She was seen by me for evaluation of normocytic anemia.  We have ordered some blood work and a CT scan of the chest to complete work-up for weight loss as she was a smoker.  However patient felt very weak while she was going home and ended up in the ER.  All the blood work was done in the ER along with a CT scan of the chest which showed a right lung mass with mediastinal and hilar adenopathy.  She was admitted to the hospital secondary to weakness.  Her hemoglobin was found to be 8.9.  She smoked for more than 30 years and quit about 20 years ago.  She denies any headaches but has some lightheadedness.  Family history significant for brother with colon cancer and sister with cancer.  She never had a colonoscopy.  Denies any new onset cough or hemoptysis.  PAST MEDICAL HISTORY:   Past Medical History:  Diagnosis Date  . Anemia   . Anxiety   . Arthritis   . High cholesterol   . History of shingles     ALLERGIES: No Known Allergies    MEDICATIONS: I have reviewed the patient's current medications.     PAST SURGICAL HISTORY Past Surgical History:  Procedure Laterality Date  . CATARACT EXTRACTION Left   . CATARACT EXTRACTION W/PHACO Right 08/16/2017   Procedure: CATARACT EXTRACTION PHACO AND INTRAOCULAR LENS PLACEMENT (IOC);  Surgeon: Baruch Goldmann, MD;   Location: [facility];  Service: Ophthalmology;  Laterality: Right;  CDE: 18.89    FAMILY HISTORY: Family History  Problem Relation Age of Onset  . Cancer Brother   . Stroke Daughter     SOCIAL HISTORY:  reports that she quit smoking about 21 years ago. Her smoking use included cigarettes. She has a 0.80 pack-year smoking history. She has never used smokeless tobacco. She reports that she does not drink alcohol or use drugs.  PERFORMANCE STATUS: The patient's performance status is 3 - Symptomatic, >50% confined to bed  PHYSICAL EXAM: Most Recent Vital Signs: Blood pressure (!) 146/69, pulse 88, temperature 98.9 F (37.2 C), temperature source Oral, resp. rate 16, height 5\' 5"  (1.651 m), weight 74 lb 1.2 oz (33.6 kg), SpO2 99 %. BP (!) 146/69 (BP Location: Right Arm)   Pulse 88   Temp 98.9 F (37.2 C) (Oral)   Resp 16   Ht 5\' 5"  (1.651 m)   Wt 74 lb 1.2 oz (33.6 kg)   SpO2 99%   BMI 12.33 kg/m  General appearance: alert, cooperative and appears stated age Neck: no adenopathy, supple, symmetrical, trachea midline and thyroid not enlarged, symmetric, no tenderness/mass/nodules Lungs: clear to auscultation bilaterally Heart: regular rate and rhythm Abdomen: soft, non-tender; bowel sounds normal; no masses,  no organomegaly Extremities: extremities normal, atraumatic, no cyanosis or edema Skin: Skin color, texture, turgor  normal. No rashes or lesions Lymph nodes: Cervical, supraclavicular, and axillary nodes normal. Neurologic: Grossly normal  LABORATORY DATA:  Results for orders placed or performed during the hospital encounter of 11/05/18 (from the past 48 hour(s))  TSH     Status: Abnormal   Collection Time: 11/05/18  3:09 PM  Result Value Ref Range   TSH 8.681 (H) 0.350 - 4.500 uIU/mL    Comment: Performed by a 3rd Generation assay with a functional sensitivity of <=0.01 uIU/mL. Performed at [facility], [address] [place], [place] [address]   Protime-INR     Status:  None   Collection Time: 11/05/18  3:09 PM  Result Value Ref Range   Prothrombin Time 13.9 11.4 - 15.2 seconds   INR 1.1 0.8 - 1.2    Comment: (NOTE) INR goal varies based on device and disease states. Performed at [facility], [address] [place], [place] [address]   Type and screen     Status: None   Collection Time: 11/05/18  5:03 PM  Result Value Ref Range   ABO/RH(D) A POS    Antibody Screen NEG    Sample Expiration 11/08/2018,2359    Unit Number [id number]    Blood Component Type RED CELLS,LR    Unit division 00    Status of Unit ISSUED,FINAL    Transfusion Status OK TO TRANSFUSE    Crossmatch Result      Compatible Performed at [facility], [address] [place], [place] [address]   Prepare RBC     Status: None   Collection Time: 11/05/18  5:03 PM  Result Value Ref Range   Order Confirmation      ORDER PROCESSED BY BLOOD BANK Performed at [facility], [address] [place], [place] [address]   ABO/Rh     Status: None   Collection Time: 11/05/18  5:03 PM  Result Value Ref Range   ABO/RH(D)      A POS Performed at [facility], [address], [place], [place] [address]   Folate     Status: None   Collection Time: 11/05/18  5:03 PM  Result Value Ref Range   Folate 19.1 >5.9 ng/mL    Comment: Performed at [facility], [address] [place], [place] [address]  Reticulocytes     Status: Abnormal   Collection Time: 11/05/18  5:03 PM  Result Value Ref Range   Retic Ct Pct 1.1 0.4 - 3.1 %   RBC. 2.51 (L) 3.87 - 5.11 MIL/uL   Retic Count, Absolute 27.4 19.0 - 186.0 K/uL   Immature Retic Fract 15.8 2.3 - 15.9 %    Comment: Performed at [facility], [address], [place], [place] [address]  Troponin I (High Sensitivity)     Status: None   Collection Time: 11/05/18  6:13 PM  Result Value Ref Range   Troponin I (High Sensitivity) 14 <18 ng/L    Comment: (NOTE) Elevated high sensitivity troponin I (hsTnI) values and significant  changes across serial  measurements may suggest ACS but many other  chronic and acute conditions are known to elevate hsTnI results.  Refer to the "Links" section for chest pain algorithms and additional  guidance. Performed at [facility], [address], [place],  [address]   Vitamin B12     Status: None   Collection Time: 11/05/18  6:14 PM  Result Value Ref Range   Vitamin B-12 586 180 - 914 pg/mL    Comment: (NOTE) This assay  is not validated for testing neonatal or myeloproliferative syndrome specimens for Vitamin B12 levels. Performed at [facility], [address] [place], [place] [address]   Iron and TIBC     Status: Abnormal   Collection Time: 11/05/18  6:14 PM  Result Value Ref Range   Iron 7 (L) 28 - 170 ug/dL   TIBC 156 (L) 250 - 450 ug/dL   Saturation Ratios 5 (L) 10.4 - 31.8 %   UIBC 149 ug/dL    Comment: Performed at [facility], [address], [place], [place] [address]  Ferritin     Status: Abnormal   Collection Time: 11/05/18  6:14 PM  Result Value Ref Range   Ferritin 490 (H) 11 - 307 ng/mL    Comment: Performed at [facility], [address] [place], [place] [address]  SARS Coronavirus 2 [facility] order, Performed in [facility] hospital lab) Nasopharyngeal Nasopharyngeal Swab     Status: None   Collection Time: 11/05/18  6:18 PM   Specimen: Nasopharyngeal Swab  Result Value Ref Range   SARS Coronavirus 2 NEGATIVE NEGATIVE    Comment: (NOTE) If result is NEGATIVE SARS-CoV-2 target nucleic acids are NOT DETECTED. The SARS-CoV-2 RNA is generally detectable in upper and lower  respiratory specimens during the acute phase of infection. The lowest  concentration of SARS-CoV-2 viral copies this assay can detect is 250  copies / mL. A negative result does not preclude SARS-CoV-2 infection  and should not be used as the sole basis for treatment or other  patient management decisions.  A negative result may occur with  improper specimen collection / handling, submission of  specimen other  than nasopharyngeal swab, presence of viral mutation(s) within the  areas targeted by this assay, and inadequate number of viral copies  (<250 copies / mL). A negative result must be combined with clinical  observations, patient history, and epidemiological information. If result is POSITIVE SARS-CoV-2 target nucleic acids are DETECTED. The SARS-CoV-2 RNA is generally detectable in upper and lower  respiratory specimens dur ing the acute phase of infection.  Positive  results are indicative of active infection with SARS-CoV-2.  Clinical  correlation with patient history and other diagnostic information is  necessary to determine patient infection status.  Positive results do  not rule out bacterial infection or co-infection with other viruses. If result is PRESUMPTIVE POSTIVE SARS-CoV-2 nucleic acids MAY BE PRESENT.   A presumptive positive result was obtained on the submitted specimen  and confirmed on repeat testing.  While 2019 novel coronavirus  (SARS-CoV-2) nucleic acids may be present in the submitted sample  additional confirmatory testing may be necessary for epidemiological  and / or clinical management purposes  to differentiate between  SARS-CoV-2 and other Sarbecovirus currently known to infect humans.  If clinically indicated additional testing with an alternate test  methodology [id number]) is advised. The SARS-CoV-2 RNA is generally  detectable in upper and lower respiratory sp ecimens during the acute  phase of infection. The expected result is Negative. Fact Sheet for Patients:  StrictlyIdeas.no Fact Sheet for Healthcare Providers: BankingDealers.co.za This test is not yet approved or cleared by the [place] FDA and has been authorized for detection and/or diagnosis of SARS-CoV-2 by FDA under an Emergency Use Authorization (EUA).  This EUA will remain in effect (meaning this test can be used) for the  duration of the COVID-19 declaration under Section 564(b)(1) of the Act, 21 U.S.C. section 360bbb-3(b)(1), unless the authorization is terminated or revoked sooner.  Performed at [facility], [address], [place], [place] [address]   CBC     Status: Abnormal   Collection Time: 11/06/18  4:58 AM  Result Value Ref Range   WBC 12.1 (H) 4.0 - 10.5 K/uL   RBC 3.17 (L) 3.87 - 5.11 MIL/uL   Hemoglobin 8.9 (L) 12.0 - 15.0 g/dL    Comment: POST TRANSFUSION SPECIMEN DELTA NOTED    HCT 28.7 (L) 36.0 - 46.0 %   MCV 90.5 80.0 - 100.0 fL   MCH 28.1 26.0 - 34.0 pg   MCHC 31.0 30.0 - 36.0 g/dL   RDW 16.0 (H) 11.5 - 15.5 %   Platelets 629 (H) 150 - 400 K/uL   nRBC 0.0 0.0 - 0.2 %    Comment: Performed at [facility], [address] [place], [place] [address]  Basic metabolic panel     Status: Abnormal   Collection Time: 11/06/18  4:58 AM  Result Value Ref Range   Sodium 137 135 - 145 mmol/L   Potassium 4.8 3.5 - 5.1 mmol/L   Chloride 107 98 - 111 mmol/L   CO2 22 22 - 32 mmol/L   Glucose, Bld 82 70 - 99 mg/dL   BUN 27 (H) 8 - 23 mg/dL   Creatinine, Ser 1.21 (H) 0.44 - 1.00 mg/dL   Calcium 8.2 (L) 8.9 - 10.3 mg/dL   GFR calc non Af Amer 41 (L) >60 mL/min   GFR calc Af Amer 47 (L) >60 mL/min   Anion gap 8 5 - 15    Comment: Performed at [facility], [address], [place], [place] [address]  Magnesium     Status: None   Collection Time: 11/06/18  4:58 AM  Result Value Ref Range   Magnesium 2.1 1.7 - 2.4 mg/dL    Comment: Performed at [facility], [address] [place], [place] [address]  Phosphorus     Status: None   Collection Time: 11/06/18  4:58 AM  Result Value Ref Range   Phosphorus 3.8 2.5 - 4.6 mg/dL    Comment: Performed at [facility], [address], [place], [place] [address]      RADIOGRAPHY: Ct Chest W Contrast  Result Date: 11/05/2018 CLINICAL DATA:  Lung nodule noted on CT abdomen. EXAM: CT CHEST WITH CONTRAST TECHNIQUE: Multidetector CT imaging of the chest was  performed during intravenous contrast administration. CONTRAST:  72mL OMNIPAQUE IOHEXOL 300 MG/ML  SOLN COMPARISON:  CT abdomen 10/24/2018 FINDINGS: Cardiovascular: Diffuse aortic and coronary artery atherosclerosis. Heart is normal size. Aorta is normal caliber. Mediastinum/Nodes: Bulky right hilar adenopathy. Index posterior right hilar lymph node has a short axis diameter of 2 cm. Other similarly sized right hilar lymph nodes. Right paratracheal adenopathy with index right paratracheal lymph node having a short axis diameter of 13 mm. No axillary adenopathy. Subcarinal adenopathy noted. Lungs/Pleura: Anterior right middle lobe nodule measures 13 mm in greatest diameter. Vague ground-glass area posteriorly in the right upper lobe. Left lung clear. No effusions. Upper Abdomen: Diffuse low-density throughout the liver, likely fatty infiltration. Cysts in the kidneys bilaterally with cortical thinning/scarring. Musculoskeletal: Chest wall soft tissues are unremarkable. No acute bony abnormality. IMPRESSION: 13 mm anterior right middle lobe nodule. Bulky right hilar adenopathy. Mediastinal adenopathy. Findings most suggestive of lung cancer. Vague ground-glass area in the posterior right upper lobe. This could reflect an area of inflammation although low-grade adenocarcinoma can have this appearance. Recommend attention on follow-up imaging. Coronary artery disease. Aortic Atherosclerosis (ICD10-I70.0). Electronically Signed  By: Rolm Baptise M.D.   On: 11/05/2018 21:56       ASSESSMENT and PLAN:  1. At least stage III lung cancer: -She had 50 pound weight loss in the last 1 to 2 years. -She is an ex-smoker, quit 20 years ago, smoked a pack a day for 30 years. - CT of the chest on 11/05/2018 showed 13 mm anterior right middle lobe nodule.  Bulky right hilar adenopathy with largest lymph node measuring 2 cm.  Right paratracheal adenopathy measuring 13 mm in short axis.  Subcarinal adenopathy noted. -Patient  fairly weak secondary to her weight loss. -We discussed various options including palliative care versus active therapy with chemoradiation therapy.  Based on her weight loss and functional status, she would not be a good candidate for active treatment. - She has 1 daughter who lives in [place].  I will call and talk to her. - Patient at this time wishes no active treatment.  She will proceed with hospice at home.  2.  Normocytic anemia: -Hemoglobin today is 8.9.  MCV is 90.5. -This is from combination of chronic kidney disease and chronic inflammation from malignancy.  B12 and folate levels are normal.  LDH was normal. -No active intervention necessary.  All questions were answered. The patient knows to call the clinic with any problems, questions or concerns. We can certainly see the patient much sooner if necessary.   Derek Jack

## 2018-11-06 NOTE — TOC Initial Note (Signed)
Transition of Care [facility]) - Initial/Assessment Note    Patient Details  Name: Alison Roberson MRN: [id number] Date of Birth: Jul 01, 1931  Transition [facility] Care [facility]) CM/SW Contact:    Ihor Gully, LCSW Phone Number: 11/06/2018, 11:18 AM  Clinical Narrative:                 Patient is independent at baseline. Daughter, Ms. Alison Roberson, provided the history. Her daughter checks on her daily and patient has very supportive neighbors in her condo community. Ms. Alison Roberson indicates that patient does not want to leave her home to go in to placement and that she does not feel that patient is unsafe at this time.  She states that she shops for patient and that due to Covid she does not take patient out shopping and to dinner as she use to. She states that patient can come live in the home with her and she has asked patient to do so but patient does not want to leave her home.  Ms. Alison Roberson states that patient needs to have a biopsy but is currently declining the biopsy saying that she is too old. She states that she will speak with patient about having it.  PT evaluation recommendations were discussed. Ms. Alison Roberson is agreeable to HHPT for patient. HHPT providers were discussed and choices given.  Referrals made to choices.  TOC will continue to follow through discharge and address issues as they arise.    Expected Discharge Plan: [place] Barriers to Discharge: Continued Medical Work up   Patient Goals and CMS Choice     Choice offered to / list presented to : Adult Children  Expected Discharge Plan and Services Expected Discharge Plan: [place]       Living arrangements for the past 2 months: Apartment Expected Discharge Date: 11/07/18                                    Prior Living Arrangements/Services Living arrangements for the past 2 months: Apartment Lives with:: Self Patient language and need for interpreter reviewed:: Yes Do you feel safe  going back to the place where you live?: Yes      Need for Family Participation in Patient Care: Yes (Comment) Care giver support system in place?: Yes (comment)   Criminal Activity/Legal Involvement Pertinent to Current Situation/Hospitalization: No - Comment as needed  Activities of Daily Living Home Assistive Devices/Equipment: None ADL Screening (condition at time of admission) Patient's cognitive ability adequate to safely complete daily activities?: Yes Is the patient deaf or have difficulty hearing?: No Does the patient have difficulty seeing, even when wearing glasses/contacts?: No Does the patient have difficulty concentrating, remembering, or making decisions?: No Patient able to express need for assistance with ADLs?: Yes Does the patient have difficulty dressing or bathing?: Yes Independently performs ADLs?: Yes (appropriate for developmental age) Does the patient have difficulty walking or climbing stairs?: Yes Weakness of Legs: Both Weakness of Arms/Hands: Both  Permission Sought/Granted Permission sought to share information with : Family Supports          Permission granted to share info w Relationship: Dtr. Ms. Alison Roberson     Emotional Assessment       Orientation: : Oriented to Self Alcohol / Substance Use: Not Applicable Psych Involvement: No (comment)  Admission diagnosis:  Iron deficiency anemia due to chronic blood loss [D50.0] Patient  Active Problem List   Diagnosis Date Noted  . Failure to thrive in adult 11/06/2018  . CKD (chronic kidney disease) stage 3, GFR 30-59 ml/min (HCC) 11/06/2018  . Iron deficiency anemia due to chronic blood loss   . Normocytic anemia 11/05/2018  . CKD (chronic kidney disease) 11/05/2018  . Weight loss, unintentional 11/05/2018  . Symptomatic anemia 11/05/2018  . Thrombocythemia ([place]) 11/05/2018  . Chronic back pain 11/05/2018  . Acute renal failure superimposed on stage 3 chronic kidney disease ([place]) 11/05/2018   PCP:   Neale Burly, MD Pharmacy:   [facility] [place], [place] [address] [place] [address] Phone: [phone] Fax: [phone]     Social Determinants of Health (SDOH) Interventions    Readmission Risk Interventions No flowsheet data found.

## 2018-11-06 NOTE — Consult Note (Signed)
Consultation Note Date: 11/06/2018   Patient Name: Alison Roberson  DOB: 17-Dec-1931  MRN: [id number]  Age / Sex: 85 y.o., female  PCP: Neale Burly, MD Referring Physician: Orson Eva, MD  Reason for Consultation: Establishing goals of care  HPI/Patient Profile: 85 y.o. female  with past medical history of mild dementia, anemia, CKD stage 3, HLD, arthritis, anxiety, h/o shingles admitted on 11/05/2018 with weakness and dizziness. CT noted for 1 cm nodular opacity right middle lobe as well as mediastinal, R hilar, and paratracheal lymphadenopathy. She has lost > 50 lbs over the past 2 years and now under 80 lbs. Appetite extremely poor.   Clinical Assessment and Goals of Care: I met today with Alison Roberson. She is thin, frail but sitting on side of bed. She is smiling and has no complaints. She is "feeling better." I asked her about what she knew was going on with her and she tells me that they tell her that she has cancer but she doesn't think she does. Upon further discussion she tells me that her husband had esophageal cancer and would "fall out" and had to have surgery and trach and lived for a while with this before he died. She tells me that she doesn't have any of these symptoms. I further explained that her scans along with her significant weight loss make [place] think that she does have a cancer but it is different from her husband's.   We further discussed her wishes if she does have a cancer what she would want to do about this. She is VERY clear in conversation "I am 85 years old; I can't be going through all that." She is clear she wants no invasive work up and either way she would not want treatment (chemo or radiation) as she believes "I am too old for that." She tells me that she has had a full life and feels that she has lived well and been a good person. She tells me that she has peace and is "ready to  go" at anytime. She just paid off her house and now owns it and hopes to die at home if possible. She wishes to focus on comfort and QOL at home. She confirms desire for DNR. She also agrees that hospice would be a good option for her to support her at her home.   I called daughter, Alison Roberson (with Alison Roberson' permission). I explained my conversation with her mother and Alison Roberson tells me that she has spoken with her mother as well and she has her mind made up. Alison Roberson tells me that she supports her mother's decisions it is just difficult for her to accept she may be approaching the end of her life. She is supportive of focus on comfort and hospice at home. She appreciates the care her mother has received.   All questions/concerns addressed. Emotional support provided.   Primary Decision Maker PATIENT (3 living children but Alison Roberson is the only one reasonably available as the others are sick themselves and one  has not had contact with them for years; she had one son that died)    Alison Roberson   - DNR - No desire for further invasive work up or desire for treatment of cancer - Home with hospice support  Code Status/Advance Care Planning:  DNR   Symptom Management:   Malnutrition: Ensure BID and multivitamin.   Palliative Prophylaxis:   Bowel Regimen, Delirium Protocol and Frequent Pain Assessment  Additional Recommendations (Limitations, Scope, Preferences):  Comfort focused care.   Psycho-social/Spiritual:   Desire for further Chaplaincy support:no  Additional Recommendations: Education on Hospice and Grief/Bereavement Support  Prognosis:   < 6 months  Discharge Planning: Home with Hospice      Primary Diagnoses: Present on Admission:  Symptomatic anemia  Weight loss, unintentional  Thrombocythemia (Alison Roberson)  Chronic back pain   I have reviewed the medical record, interviewed the patient and family, and examined the patient. The following aspects  are pertinent.  Past Medical History:  Diagnosis Date   Anemia    Anxiety    Arthritis    High cholesterol    History of shingles    Social History   Socioeconomic History   Marital status: Widowed    Spouse name: Not on file   Number of children: 4   Years of education: Not on file   Highest education level: Not on file  Occupational History   Occupation: retired  Scientist, product/process development strain: Not on file   Food insecurity    Worry: Not on file    Inability: Not on Lexicographer needs    Medical: Not on file    Non-medical: Not on file  Tobacco Use   Smoking status: Former Smoker    Packs/day: 0.02    Years: 40.00    Pack years: 0.80    Types: Cigarettes    Quit date: 02/26/1997    Years since quitting: 21.7   Smokeless tobacco: Never Used  Substance and Sexual Activity   Alcohol use: No   Drug use: No   Sexual activity: Not Currently    Birth control/protection: None  Lifestyle   Physical activity    Days per week: Not on file    Minutes per session: Not on file   Stress: Not on file  Relationships   Social connections    Talks on phone: Not on file    Gets together: Not on file    Attends religious service: Not on file    Active member of club or organization: Not on file    Attends meetings of clubs or organizations: Not on file    Relationship status: Not on file  Other Topics Concern   Not on file  Social History Narrative   Not on file   Family History  Problem Relation Age of Onset   Cancer Brother    Stroke Daughter    Scheduled Meds:  enoxaparin (LOVENOX) injection  30 mg Subcutaneous Q24H   influenza vaccine adjuvanted  0.5 mL Intramuscular Tomorrow-1000   pneumococcal 23 valent vaccine  0.5 mL Intramuscular Tomorrow-1000   Continuous Infusions:  sodium chloride 75 mL/hr at 11/06/18 0313   ferric gluconate (FERRLECIT/NULECIT) IV 125 mg (11/06/18 1244)   PRN Meds:.acetaminophen **OR**  acetaminophen, ondansetron **OR** ondansetron (ZOFRAN) IV, senna-docusate No Known Allergies Review of Systems  Constitutional: Positive for appetite change, fatigue and unexpected weight change.  Respiratory: Negative for shortness of breath.   Neurological: Positive for weakness.  Physical Exam Vitals signs and nursing note reviewed.  Constitutional:      General: She is not in acute distress.    Appearance: She is cachectic. She is ill-appearing.  Cardiovascular:     Rate and Rhythm: Normal rate.  Pulmonary:     Effort: Pulmonary effort is normal. No tachypnea, accessory muscle usage or respiratory distress.  Abdominal:     General: Abdomen is flat.  Neurological:     Mental Status: She is alert and oriented to person, place, and time.     Vital Signs: BP (!) 146/69 (BP Location: Right Arm)    Pulse 88    Temp 98.9 F (37.2 C) (Oral)    Resp 16    Ht '5\' 5"'$  (1.651 m)    Wt 33.6 kg    SpO2 99%    BMI 12.33 kg/m  Pain Scale: 0-10   Pain Score: 0-No pain   SpO2: SpO2: 99 % O2 Device:SpO2: 99 % O2 Flow Rate: .   IO: Intake/output summary:   Intake/Output Summary (Last 24 hours) at 11/06/2018 1311 Last data filed at 11/06/2018 0400 Gross per 24 hour  Intake 1239.94 ml  Output --  Net 1239.94 ml    LBM: Last BM Date: 11/05/18 Baseline Weight: Weight: 36.3 kg Most recent weight: Weight: 33.6 kg     Palliative Assessment/Data:     Time In: 1240 Time Out: 1340 Time Total: 60 min Greater than 50%  of this time was spent counseling and coordinating care related to the above assessment and plan.  Signed by: Vinie Sill, NP Palliative Medicine Team Pager # [phone] (M-F 8a-5p) Team Phone # [phone] (Nights/Weekends)

## 2018-11-06 NOTE — Progress Notes (Signed)
Initial Nutrition Assessment  DOCUMENTATION CODES:   Severe malnutrition in context of chronic illness, Underweight  INTERVENTION:  -Ensure Enlive po BID, each supplement provides 350 kcal and 20 grams of protein (strawberry) -Magic cup TID with meals, each supplement provides 290 kcal and 9 grams of protein -MVI daily -Education on intake of high calorie, high protein foods to promote weight gain   NUTRITION DIAGNOSIS:   Severe Malnutrition related to chronic illness as evidenced by percent weight loss, severe fat depletion, severe muscle depletion.   GOAL:   Patient will meet greater than or equal to 90% of their needs, Weight gain  MONITOR:   PO intake, Supplement acceptance, Labs, Weight trends, I & O's  REASON FOR ASSESSMENT:   Consult Assessment of nutrition requirement/status  ASSESSMENT:   85 year old female with medical history significant of chronic back pain, CKD3, shingles, anxiety, HLD, who presented to ED with progressive weakness and dizziness over the last several months; associated nausea, intermittent diarrhea and 50 lb wt loss reported over the past 2 years.  Patient admitted with symptomatic anemia  Patient hospitalized in [place] 2 months ago; pt reported that she received a blood transfusion at that time  9/10 CT - bulky mediastinal, right hilar, paratracheal lymphadenopathy; pulmonary, med/onc, palliative consulted Pulmonology reports CT highly suggestive of lung cancer; possible need outpt needle biopsy via bronchoscopy.  Very delightful patient sitting on the side of bed with lunch tray; spreading mayonnaise on sliced tomatoes, preparing to eat at time of RD visit. RD complimented food tray, stating that it looked delicious; patient offered RD to join her for lunch. Patient stated that she was not hungry, but was making herself eat. Patient reports ongoing poor appetite, forces herself to eat daily. She reports having to work up an appetite prior to  meals by doing housework such as making the bed, dusting, or taking a shower; recalls usual dietary intake of sandwiches and leftovers (ususally chicken with rice and beans) Patient reports daily intake of strawberry Ensure; RD encouraged patient to increase ONS BID, educated on intake of high calorie high protein snacks to promote weight gain (peanut butter, adding butter when cooking, mayonnaise to sandwiches, full fat milk, cheese) Patient amenable to Ensure and Magic Cup during admission.    Current wt 33.6 kg (73.9 lb) UBW 105-110 lbs 2017-2019 Wt history reviewed; Noted 11 lb wt loss over the past 2 weeks (13%; severe for time frame) 30.8 lb wt loss over the past year (29%; severe for time frame)  Medications reviewed and include: ferric gluconate  Labs: Fe 7 WBC 12.1 Hemoglobin 8.9  NUTRITION - FOCUSED PHYSICAL EXAM:    Most Recent Value  Orbital Region  Severe depletion  Upper Arm Region  Severe depletion  Thoracic and Lumbar Region  Unable to assess  Buccal Region  Severe depletion  Temple Region  Severe depletion  Clavicle Bone Region  Severe depletion  Clavicle and Acromion Bone Region  Severe depletion  Scapular Bone Region  Severe depletion  Dorsal Hand  Severe depletion  Patellar Region  Unable to assess  Anterior Thigh Region  Unable to assess  Posterior Calf Region  Severe depletion  Edema (RD Assessment)  None  Hair  Reviewed  Eyes  Reviewed  Mouth  Reviewed [well fitting dentures]  Skin  Reviewed  Nails  Reviewed       Diet Order:   Diet Order            Diet regular Room service  appropriate? Yes; Fluid consistency: Thin  Diet effective now              EDUCATION NEEDS:   No education needs have been identified at this time  Skin:  Skin Assessment: Reviewed RN Assessment  Last BM:  9/9  Height:   Ht Readings from Last 1 Encounters:  11/05/18 5\' 5"  (1.651 m)    Weight:   Wt Readings from Last 1 Encounters:  11/05/18 33.6 kg    Ideal  Body Weight:  56.8 kg  BMI:  Body mass index is 12.33 kg/m.  Estimated Nutritional Needs:   Kcal:  1300-1500  Protein:  65-75  Fluid:  >1.3L/day   Lajuan Lines, RD, LDN Office [phone] After Hours/Weekend Pager: [phone]

## 2018-11-06 NOTE — Plan of Care (Signed)
  Problem: Acute Rehab PT Goals(only PT should resolve) Goal: Pt Will Ambulate Outcome: Progressing Flowsheets (Taken 11/06/2018 1419) Pt will Ambulate:  100 feet  with modified independence  with cane   2:19 PM, 11/06/18 Lonell Grandchild, MPT Physical Therapist with [facility] [phone] office [phone] mobile phone

## 2018-11-07 ENCOUNTER — Inpatient Hospital Stay (HOSPITAL_COMMUNITY): Payer: Medicare HMO

## 2018-11-07 DIAGNOSIS — Z66 Do not resuscitate: Secondary | ICD-10-CM | POA: Diagnosis not present

## 2018-11-07 DIAGNOSIS — E7800 Pure hypercholesterolemia, unspecified: Secondary | ICD-10-CM | POA: Diagnosis not present

## 2018-11-07 DIAGNOSIS — M1990 Unspecified osteoarthritis, unspecified site: Secondary | ICD-10-CM | POA: Diagnosis present

## 2018-11-07 DIAGNOSIS — R64 Cachexia: Secondary | ICD-10-CM | POA: Diagnosis not present

## 2018-11-07 DIAGNOSIS — R634 Abnormal weight loss: Secondary | ICD-10-CM | POA: Diagnosis not present

## 2018-11-07 DIAGNOSIS — R627 Adult failure to thrive: Secondary | ICD-10-CM | POA: Diagnosis not present

## 2018-11-07 DIAGNOSIS — Z20828 Contact with and (suspected) exposure to other viral communicable diseases: Secondary | ICD-10-CM | POA: Diagnosis not present

## 2018-11-07 DIAGNOSIS — Z7189 Other specified counseling: Secondary | ICD-10-CM | POA: Diagnosis not present

## 2018-11-07 DIAGNOSIS — Z681 Body mass index (BMI) 19 or less, adult: Secondary | ICD-10-CM | POA: Diagnosis not present

## 2018-11-07 DIAGNOSIS — I471 Supraventricular tachycardia: Secondary | ICD-10-CM

## 2018-11-07 DIAGNOSIS — D649 Anemia, unspecified: Secondary | ICD-10-CM | POA: Diagnosis not present

## 2018-11-07 DIAGNOSIS — N183 Chronic kidney disease, stage 3 (moderate): Secondary | ICD-10-CM | POA: Diagnosis not present

## 2018-11-07 DIAGNOSIS — I340 Nonrheumatic mitral (valve) insufficiency: Secondary | ICD-10-CM | POA: Diagnosis not present

## 2018-11-07 DIAGNOSIS — N179 Acute kidney failure, unspecified: Secondary | ICD-10-CM | POA: Diagnosis not present

## 2018-11-07 DIAGNOSIS — D500 Iron deficiency anemia secondary to blood loss (chronic): Secondary | ICD-10-CM | POA: Diagnosis not present

## 2018-11-07 DIAGNOSIS — Z823 Family history of stroke: Secondary | ICD-10-CM | POA: Diagnosis not present

## 2018-11-07 DIAGNOSIS — I361 Nonrheumatic tricuspid (valve) insufficiency: Secondary | ICD-10-CM | POA: Diagnosis not present

## 2018-11-07 DIAGNOSIS — Z515 Encounter for palliative care: Secondary | ICD-10-CM | POA: Diagnosis not present

## 2018-11-07 DIAGNOSIS — Z87891 Personal history of nicotine dependence: Secondary | ICD-10-CM | POA: Diagnosis not present

## 2018-11-07 DIAGNOSIS — R590 Localized enlarged lymph nodes: Secondary | ICD-10-CM | POA: Diagnosis present

## 2018-11-07 DIAGNOSIS — Z800 Family history of malignant neoplasm of digestive organs: Secondary | ICD-10-CM | POA: Diagnosis not present

## 2018-11-07 DIAGNOSIS — C342 Malignant neoplasm of middle lobe, bronchus or lung: Secondary | ICD-10-CM | POA: Diagnosis not present

## 2018-11-07 DIAGNOSIS — R7989 Other specified abnormal findings of blood chemistry: Secondary | ICD-10-CM | POA: Diagnosis present

## 2018-11-07 DIAGNOSIS — E43 Unspecified severe protein-calorie malnutrition: Secondary | ICD-10-CM

## 2018-11-07 DIAGNOSIS — E785 Hyperlipidemia, unspecified: Secondary | ICD-10-CM | POA: Diagnosis present

## 2018-11-07 DIAGNOSIS — C3411 Malignant neoplasm of upper lobe, right bronchus or lung: Secondary | ICD-10-CM | POA: Diagnosis not present

## 2018-11-07 DIAGNOSIS — D473 Essential (hemorrhagic) thrombocythemia: Secondary | ICD-10-CM | POA: Diagnosis not present

## 2018-11-07 DIAGNOSIS — M545 Low back pain: Secondary | ICD-10-CM | POA: Diagnosis present

## 2018-11-07 DIAGNOSIS — G8929 Other chronic pain: Secondary | ICD-10-CM | POA: Diagnosis present

## 2018-11-07 LAB — CBC
HCT: 28.4 % — ABNORMAL LOW (ref 36.0–46.0)
Hemoglobin: 8.8 g/dL — ABNORMAL LOW (ref 12.0–15.0)
MCH: 28.2 pg (ref 26.0–34.0)
MCHC: 31 g/dL (ref 30.0–36.0)
MCV: 91 fL (ref 80.0–100.0)
Platelets: 628 10*3/uL — ABNORMAL HIGH (ref 150–400)
RBC: 3.12 MIL/uL — ABNORMAL LOW (ref 3.87–5.11)
RDW: 17 % — ABNORMAL HIGH (ref 11.5–15.5)
WBC: 11.2 10*3/uL — ABNORMAL HIGH (ref 4.0–10.5)
nRBC: 0 % (ref 0.0–0.2)

## 2018-11-07 LAB — PROTEIN ELECTROPHORESIS, SERUM
A/G Ratio: 0.7 (ref 0.7–1.7)
Albumin ELP: 2.8 g/dL — ABNORMAL LOW (ref 2.9–4.4)
Alpha-1-Globulin: 0.5 g/dL — ABNORMAL HIGH (ref 0.0–0.4)
Alpha-2-Globulin: 1 g/dL (ref 0.4–1.0)
Beta Globulin: 1.2 g/dL (ref 0.7–1.3)
Gamma Globulin: 1.3 g/dL (ref 0.4–1.8)
Globulin, Total: 4 g/dL — ABNORMAL HIGH (ref 2.2–3.9)
Total Protein ELP: 6.8 g/dL (ref 6.0–8.5)

## 2018-11-07 LAB — METHYLMALONIC ACID, SERUM: Methylmalonic Acid, Quantitative: 209 nmol/L (ref 0–378)

## 2018-11-07 LAB — ECHOCARDIOGRAM COMPLETE
Height: 65 in
Weight: 1298.07 oz

## 2018-11-07 MED ORDER — METOPROLOL TARTRATE 25 MG PO TABS
25.0000 mg | ORAL_TABLET | Freq: Two times a day (BID) | ORAL | Status: DC
  Administered 2018-11-07 – 2018-11-08 (×3): 25 mg via ORAL
  Filled 2018-11-07 (×3): qty 1

## 2018-11-07 MED ORDER — SODIUM CHLORIDE 0.9 % IV SOLN
INTRAVENOUS | Status: AC
  Administered 2018-11-07: 12:00:00 via INTRAVENOUS

## 2018-11-07 MED ORDER — ENSURE ENLIVE PO LIQD: 237.0000 mL | Freq: Two times a day (BID) | ORAL | 12 refills | Status: AC

## 2018-11-07 NOTE — Progress Notes (Signed)
*  PRELIMINARY RESULTS* Echocardiogram 2D Echocardiogram has been performed.  Leavy Cella 11/07/2018, 4:45 PM

## 2018-11-07 NOTE — Progress Notes (Signed)
Oncology and palliative care notes seen and appreciated.  She has elected hospice at home which I think is appropriate.  I will plan to sign off

## 2018-11-07 NOTE — Progress Notes (Signed)
Palliative:  HPI: 85 y.o. female  with past medical history of mild dementia, anemia, CKD stage 3, HLD, arthritis, anxiety, h/o shingles admitted on 11/05/2018 with weakness and dizziness. CT noted for 1 cm nodular opacity right middle lobe as well as mediastinal, R hilar, and paratracheal lymphadenopathy. She has lost > 50 lbs over the past 2 years and now under 80 lbs. Appetite extremely poor. She continues without complaint.    I met again today with Ms. Ent. She was up at sink and was returning back to bed with NT to assist. Ms. Flippin is weak but ambulates well and needs no assist. She tells me that her home is easy to manage and not very large. She states her bathroom is about the same distance from her hospital bed as it is at home. She tells me that she manages well at home and has no concerns about returning home. She also denies any need for any equipment at home. She tells me that her bed is low and easy to get in and out. She is very hopeful to return home today.   She has no questions or concerns. She is not concerned about her illness or end of life. She is at peace and tells me that she has lived a full life and feels she has been a good person. All questions/concerns addressed. Emotional support provided.   Exam: Alert, oriented. Thin, frail, cachectic. Breathing regular, unlabored. Abd flat. Generalized weakness.   Plan: - Home with hospice support when medically optimized.  - She does not desire invasive diagnostics or treatments. She is not interested in any chemo/radiation therapy.  - I spoke with daughter, Joycelyn Schmid, yesterday who supports her mother's decision for focus on comfort and QOL.   15 min  Vinie Sill, NP Palliative Medicine Team Pager [phone] (Please see [email or web address] for schedule) Team Phone [phone]    Greater than 50%  of this time was spent counseling and coordinating care related to the above assessment and plan

## 2018-11-07 NOTE — Evaluation (Signed)
Clinical/Bedside Swallow Evaluation Patient Details  Name: Alison Roberson MRN: [id number] Date of Birth: 10/24/1931  Today's Date: 11/07/2018 Time: SLP Start Time (ACUTE ONLY): 1015 SLP Stop Time (ACUTE ONLY): 1027 SLP Time Calculation (min) (ACUTE ONLY): 12 min  Past Medical History:  Past Medical History:  Diagnosis Date  . Anemia   . Anxiety   . Arthritis   . High cholesterol   . History of shingles    Past Surgical History:  Past Surgical History:  Procedure Laterality Date  . CATARACT EXTRACTION Left   . CATARACT EXTRACTION W/PHACO Right 08/16/2017   Procedure: CATARACT EXTRACTION PHACO AND INTRAOCULAR LENS PLACEMENT (IOC);  Surgeon: Baruch Goldmann, MD;  Location: [facility];  Service: Ophthalmology;  Laterality: Right;  CDE: 18.55   HPI:  85 year old female with a history of CKD stage III, chronic back pain, hyperlipidemia presenting with 1 to 70-month history of progressive generalized weakness, intermittent dizziness, and decreased oral intake.  The patient has had some intermittent nausea with occasional loose stools.  She denied any hematochezia, melena, fevers, chills, chest pain, shortness of breath, hematemesis, dysuria, hematuria.  The patient lives by herself and has been resistant to see her physicians.  The patient is able to manage her ADLs independently.  She states that she does not use any assistive devices.  The patient was seen by Dr. Delton Coombes in the emergency department due to the above symptoms.  He ordered LDH, reticulocyte count, SPEP, copper level, and methylmalonic acid levels.  CT of the abdomen and pelvis on 10/24/2018 showed a small amount nonspecific free fluid in the pelvis with intermittent origin.  There was also indication of a 1 cm opacity in the right middle lobe that was incompletely characterized.  CT of the chest on 11/06/2018 showed bulky mediastinal, right hilar, and paratracheal lymphadenopathy with a vague groundglass opacity in right upper lobe.   Pulmonary medicine was consulted to assist with management.  Dr. Delton Coombes was also reconsulted. BSE requested.   Assessment / Plan / Recommendation Clinical Impression  Pt was administered clinical swallowing evaluation; Pt repositioned self upon SLP entrance to upright position in the bed. Pt did not present with s/sx of oropharyngeal dysphagia with any textures/consistencies administered. She consumed regular textures, puree textures and thin liquids without overt s/sx of aspiration. SLP reviewed universal aspiration precautions with patient. There are no further ST needs noted at this time, ST to sign off. SLP Visit Diagnosis: Dysphagia, unspecified (R13.10)    Aspiration Risk  Mild aspiration risk    Diet Recommendation Regular;Thin liquid   Liquid Administration via: Cup;Straw Medication Administration: Whole meds with liquid Supervision: Patient able to self feed Compensations: Minimize environmental distractions;Slow rate;Small sips/bites Postural Changes: Seated upright at 90 degrees;Remain upright for at least 30 minutes after po intake    Other  Recommendations Oral Care Recommendations: Oral care BID   Follow up Recommendations None      Frequency and Duration min 1 x/week  1 week       Prognosis Prognosis for Safe Diet Advancement: Fair      Swallow Study   General Date of Onset: 11/05/18 HPI: 85 year old female with a history of CKD stage III, chronic back pain, hyperlipidemia presenting with 1 to 64-month history of progressive generalized weakness, intermittent dizziness, and decreased oral intake.  The patient has had some intermittent nausea with occasional loose stools.  She denied any hematochezia, melena, fevers, chills, chest pain, shortness of breath, hematemesis, dysuria, hematuria.  The patient lives by herself  and has been resistant to see her physicians.  The patient is able to manage her ADLs independently.  She states that she does not use any assistive  devices.  The patient was seen by Dr. Delton Coombes in the emergency department due to the above symptoms.  He ordered LDH, reticulocyte count, SPEP, copper level, and methylmalonic acid levels.  CT of the abdomen and pelvis on 10/24/2018 showed a small amount nonspecific free fluid in the pelvis with intermittent origin.  There was also indication of a 1 cm opacity in the right middle lobe that was incompletely characterized.  CT of the chest on 11/06/2018 showed bulky mediastinal, right hilar, and paratracheal lymphadenopathy with a vague groundglass opacity in right upper lobe.  Pulmonary medicine was consulted to assist with management.  Dr. Delton Coombes was also reconsulted. BSE requested. Type of Study: Bedside Swallow Evaluation Previous Swallow Assessment: none Diet Prior to this Study: Regular;Thin liquids Temperature Spikes Noted: No Respiratory Status: Room air History of Recent Intubation: No Behavior/Cognition: Alert;Cooperative;Pleasant mood Oral Cavity Assessment: Within Functional Limits Oral Care Completed by SLP: Recent completion by staff Oral Cavity - Dentition: Dentures, bottom;Dentures, top Vision: Functional for self-feeding Self-Feeding Abilities: Able to feed self Patient Positioning: Upright in bed Baseline Vocal Quality: Normal Volitional Cough: Strong Volitional Swallow: Able to elicit    Oral/Motor/Sensory Function Overall Oral Motor/Sensory Function: Within functional limits   Ice Chips Ice chips: Within functional limits   Thin Liquid Thin Liquid: Within functional limits    Nectar Thick Nectar Thick Liquid: Not tested   Honey Thick Honey Thick Liquid: Not tested   Puree Puree: Within functional limits   Solid     Solid: Within functional limits     Takyla Kuchera H. Roddie Mc, [place] Speech Language Pathologist  Wende Bushy 11/07/2018,10:38 AM

## 2018-11-07 NOTE — Progress Notes (Signed)
PROGRESS NOTE  Alison Roberson MWN:[id number] DOB: 1931-11-21 DOA: 11/05/2018 PCP: Neale Burly, MD  Brief History:  85 year old female with a history of CKD stage III, chronic back pain, hyperlipidemia presenting with 1 to 28-month history of progressive generalized weakness, intermittent dizziness, and decreased oral intake.  The patient has had some intermittent nausea with occasional loose stools.  She denied any hematochezia, melena, fevers, chills, chest pain, shortness of breath, hematemesis, dysuria, hematuria.  The patient lives by herself and has been resistant to see her physicians.  The patient is able to manage her ADLs independently.  She states that she does not use any assistive devices.  The patient was seen by Dr. Delton Coombes in the emergency department due to the above symptoms.  He ordered LDH, reticulocyte count, SPEP, copper level, and methylmalonic acid levels.  CT of the abdomen and pelvis on 10/24/2018 showed a small amount nonspecific free fluid in the pelvis with intermittent origin.  There was also indication of a 1 cm opacity in the right middle lobe that was incompletely characterized.  CT of the chest on 11/06/2018 showed bulky mediastinal, right hilar, and paratracheal lymphadenopathy with a vague groundglass opacity in right upper lobe.  Pulmonary medicine was consulted to assist with management.  Dr. Delton Coombes was also reconsulted. Palliative medicine was consulted to assist with management.  After extensive discussion with the patient and her daughter, the patient had made a decision not to pursue any further diagnostic or aggressive therapeutic options regarding her possible lung cancer.  She made her decision and also to pulmonary medicine as well as to medical oncology.  Palliative medicine helped assist with transitioning to home hospice. On the evening of 11/05/2016 and the morning of 11/07/2018, the patient had numerous episodes of SVT with heart rate up  into the 150s.  Fortunately, the patient remained hemodynamically stable.  As result, the patient's discharge was delayed for 24 hours to optimize her heart rate control prior to discharge.  Assessment/Plan: Symptomatic anemia -Presented with hemoglobin 7.1 -Patient had hemoglobin 10.3 on 08/09/2017 -Stool for occult blood was negative in the emergency department -Patient has never had a colonoscopy -She reportedly received a blood transfusion when she was hospitalized in [place] 2 months ago -Repeat FOBT--neg -Iron saturation 5%, ferritin 403, K74 259, folic acid 56.3 -Give Nulecit x 1 -Hemoglobin remained stable after transfusion  SVT -Patient had numerous episodes of SVT with HR up to 150s--personally reviewed telemetry -Patient is essentially asymptomatic -TSH 8.681, free T4 0.81 -Obtain echocardiogram -Start metoprolol tartrate -Magnesium 2.1  Failure to thrive -TSH 3.681 -Check free T4--0.81 -PT evaluation  Mediastinal and hilar lymphadenopathy -Appreciate pulmonary consult--> will ultimately need mediastinoscopy -Discussed with the patient concern for possible lung cancer or other malignancy -Currently refuses any type of invasive procedures or biopsy -I have spoken extensively with the patient as well as her daughter -Certainly, this can be followed up as an outpatient and mediastinoscopy can be set up as an outpatient if the patient continues to refuse invasive intervention at this time -Reconsult Dr. Delton Coombes  CKD stage III -Baseline creatinine 1.0-1.2  Elevated blood pressure -Monitor clinically for now -Started metoprolol for SVT management  Thrombocytosis -There is a component of iron deficiency contributing to this  Chronic low back pain -Patient states that this is stable and actually a little bit better -PT evaluation -She takes acetaminophen for at home which will be continued  Severe protein calorie malnutrition -Started  Ensure         Disposition Plan:   Home 9/12 if stable Family Communication:   Daughter updated 9/11  Consultants:  none  Code Status:   DNR  DVT Prophylaxis:   [place] Lovenox   Procedures: As Listed in Progress Note Above  Antibiotics: None      Subjective: Patient denies fevers, chills, headache, chest pain, dyspnea, nausea, vomiting, diarrhea, abdominal pain, dysuria, hematuria, hematochezia, and melena.   Objective: Vitals:   11/06/18 1958 11/06/18 2141 11/07/18 0334 11/07/18 0429  BP:  (!) 153/71  (!) 150/71  Pulse:  89  91  Resp:  16  17  Temp:  99 F (37.2 C)  98.1 F (36.7 C)  TempSrc:  Oral    SpO2: 93% 100%  97%  Weight:   36.8 kg   Height:        Intake/Output Summary (Last 24 hours) at 11/07/2018 1130 Last data filed at 11/07/2018 0900 Gross per 24 hour  Intake 872.94 ml  Output --  Net 872.94 ml   Weight change: 0.512 kg Exam:   General:  Pt is alert, follows commands appropriately, not in acute distress  HEENT: No icterus, No thrush, No neck mass, [place]/AT  Cardiovascular: RRR, S1/S2, no rubs, no gallops  Respiratory: CTA bilaterally, no wheezing, no crackles, no rhonchi  Abdomen: Soft/+BS, non tender, non distended, no guarding  Extremities: No edema, No lymphangitis, No petechiae, No rashes, no synovitis   Data Reviewed: I have personally reviewed following labs and imaging studies Basic Metabolic Panel: Recent Labs  Lab 11/05/18 1509 11/06/18 0458  NA 139 137  K 5.4* 4.8  CL 105 107  CO2 22 22  GLUCOSE 123* 82  BUN 26* 27*  CREATININE 1.30* 1.21*  CALCIUM 9.2 8.2*  MG  --  2.1  PHOS  --  3.8   Liver Function Tests: Recent Labs  Lab 11/05/18 1509  AST 16  ALT 13  ALKPHOS 103  BILITOT 0.7  PROT 7.3  ALBUMIN 2.7*   No results for input(s): LIPASE, AMYLASE in the last 168 hours. No results for input(s): AMMONIA in the last 168 hours. Coagulation Profile: Recent Labs  Lab 11/05/18 1509  INR 1.1    CBC: Recent Labs  Lab 11/05/18 1509 11/06/18 0458 11/07/18 0458  WBC 11.3* 12.1* 11.2*  NEUTROABS 10.1*  --   --   HGB 7.1* 8.9* 8.8*  HCT 23.9* 28.7* 28.4*  MCV 95.2 90.5 91.0  PLT 705* 629* 628*   Cardiac Enzymes: No results for input(s): CKTOTAL, CKMB, CKMBINDEX, TROPONINI in the last 168 hours. BNP: Invalid input(s): POCBNP CBG: No results for input(s): GLUCAP in the last 168 hours. HbA1C: No results for input(s): HGBA1C in the last 72 hours. Urine analysis:    Component Value Date/Time   COLORURINE STRAW (A) 10/24/2018 1217   APPEARANCEUR CLEAR 10/24/2018 1217   LABSPEC 1.026 10/24/2018 1217   PHURINE 5.0 10/24/2018 1217   GLUCOSEU NEGATIVE 10/24/2018 1217   [place] 10/24/2018 1217   [place] 10/24/2018 1217   [place] 10/24/2018 1217   PROTEINUR 30 (A) 10/24/2018 1217   NITRITE NEGATIVE 10/24/2018 1217   LEUKOCYTESUR NEGATIVE 10/24/2018 1217   Sepsis Labs: @LABRCNTIP (procalcitonin:4,lacticidven:4) ) Recent Results (from the past 240 hour(s))  SARS Coronavirus 2 [facility] order, Performed in [facility] hospital lab) Nasopharyngeal Nasopharyngeal Swab     Status: None   Collection Time: 11/05/18  6:18 PM   Specimen: Nasopharyngeal Swab  Result Value Ref Range  Status   SARS Coronavirus 2 NEGATIVE NEGATIVE Final    Comment: (NOTE) If result is NEGATIVE SARS-CoV-2 target nucleic acids are NOT DETECTED. The SARS-CoV-2 RNA is generally detectable in upper and lower  respiratory specimens during the acute phase of infection. The lowest  concentration of SARS-CoV-2 viral copies this assay can detect is 250  copies / mL. A negative result does not preclude SARS-CoV-2 infection  and should not be used as the sole basis for treatment or other  patient management decisions.  A negative result may occur with  improper specimen collection / handling, submission of specimen other  than nasopharyngeal swab, presence of viral mutation(s)  within the  areas targeted by this assay, and inadequate number of viral copies  (<250 copies / mL). A negative result must be combined with clinical  observations, patient history, and epidemiological information. If result is POSITIVE SARS-CoV-2 target nucleic acids are DETECTED. The SARS-CoV-2 RNA is generally detectable in upper and lower  respiratory specimens dur ing the acute phase of infection.  Positive  results are indicative of active infection with SARS-CoV-2.  Clinical  correlation with patient history and other diagnostic information is  necessary to determine patient infection status.  Positive results do  not rule out bacterial infection or co-infection with other viruses. If result is PRESUMPTIVE POSTIVE SARS-CoV-2 nucleic acids MAY BE PRESENT.   A presumptive positive result was obtained on the submitted specimen  and confirmed on repeat testing.  While 2019 novel coronavirus  (SARS-CoV-2) nucleic acids may be present in the submitted sample  additional confirmatory testing may be necessary for epidemiological  and / or clinical management purposes  to differentiate between  SARS-CoV-2 and other Sarbecovirus currently known to infect humans.  If clinically indicated additional testing with an alternate test  methodology [phone]) is advised. The SARS-CoV-2 RNA is generally  detectable in upper and lower respiratory sp ecimens during the acute  phase of infection. The expected result is Negative. Fact Sheet for Patients:  StrictlyIdeas.no Fact Sheet for Healthcare Providers: BankingDealers.co.za This test is not yet approved or cleared by the [place] FDA and has been authorized for detection and/or diagnosis of SARS-CoV-2 by FDA under an Emergency Use Authorization (EUA).  This EUA will remain in effect (meaning this test can be used) for the duration of the COVID-19 declaration under Section 564(b)(1) of the Act,  21 U.S.C. section 360bbb-3(b)(1), unless the authorization is terminated or revoked sooner. Performed at [facility], [address], [place], [place] [address]      Scheduled Meds:  enoxaparin (LOVENOX) injection  30 mg Subcutaneous Q24H   feeding supplement (ENSURE ENLIVE)  237 mL Oral BID BM   influenza vaccine adjuvanted  0.5 mL Intramuscular Tomorrow-1000   metoprolol tartrate  25 mg Oral BID   multivitamin with minerals  1 tablet Oral Daily   pneumococcal 23 valent vaccine  0.5 mL Intramuscular Tomorrow-1000   Continuous Infusions:  sodium chloride      Procedures/Studies: Ct Chest W Contrast  Result Date: 11/05/2018 CLINICAL DATA:  Lung nodule noted on CT abdomen. EXAM: CT CHEST WITH CONTRAST TECHNIQUE: Multidetector CT imaging of the chest was performed during intravenous contrast administration. CONTRAST:  70mL OMNIPAQUE IOHEXOL 300 MG/ML  SOLN COMPARISON:  CT abdomen 10/24/2018 FINDINGS: Cardiovascular: Diffuse aortic and coronary artery atherosclerosis. Heart is normal size. Aorta is normal caliber. Mediastinum/Nodes: Bulky right hilar adenopathy. Index posterior right hilar lymph node has a short axis diameter of 2 cm. Other similarly sized  right hilar lymph nodes. Right paratracheal adenopathy with index right paratracheal lymph node having a short axis diameter of 13 mm. No axillary adenopathy. Subcarinal adenopathy noted. Lungs/Pleura: Anterior right middle lobe nodule measures 13 mm in greatest diameter. Vague ground-glass area posteriorly in the right upper lobe. Left lung clear. No effusions. Upper Abdomen: Diffuse low-density throughout the liver, likely fatty infiltration. Cysts in the kidneys bilaterally with cortical thinning/scarring. Musculoskeletal: Chest wall soft tissues are unremarkable. No acute bony abnormality. IMPRESSION: 13 mm anterior right middle lobe nodule. Bulky right hilar adenopathy. Mediastinal adenopathy. Findings most suggestive of lung cancer.  Vague ground-glass area in the posterior right upper lobe. This could reflect an area of inflammation although low-grade adenocarcinoma can have this appearance. Recommend attention on follow-up imaging. Coronary artery disease. Aortic Atherosclerosis (ICD10-I70.0). Electronically Signed   By: Rolm Baptise M.D.   On: 11/05/2018 21:56   Ct Abdomen Pelvis W Contrast  Result Date: 10/24/2018 CLINICAL DATA:  85 year old female with acute diffuse abdominal and pelvic pain. EXAM: CT ABDOMEN AND PELVIS WITH CONTRAST TECHNIQUE: Multidetector CT imaging of the abdomen and pelvis was performed using the standard protocol following bolus administration of intravenous contrast. CONTRAST:  24mL OMNIPAQUE IOHEXOL 300 MG/ML  SOLN COMPARISON:  06/25/2015 CT and prior studies FINDINGS: Lower chest: On the 1st image, a 1 cm nodular opacity is identified within the RIGHT middle lobe. Hepatobiliary: The liver and gallbladder are unremarkable. No biliary dilatation. Pancreas: Unremarkable Spleen: Unremarkable Adrenals/Urinary Tract: Bilateral renal cortical thinning and renal cysts are present. Renal vascular calcifications are noted. No evidence of hydronephrosis or obstructing urinary calculi. Stomach/Bowel: Stomach is within normal limits. The appendix is not definitely identified. No evidence of bowel wall thickening, distention, or inflammatory changes. Vascular/Lymphatic: Aortic atherosclerosis. No enlarged abdominal or pelvic lymph nodes. Reproductive: Uterus and bilateral adnexa are unremarkable. Other: A small amount of free fluid within the pelvis is nonspecific. No evidence of focal collection or pneumoperitoneum. Musculoskeletal: No acute or suspicious bony abnormalities. Degenerative changes within the lumbar spine identified. IMPRESSION: 1. Small amount of nonspecific free fluid within the pelvis - indeterminate origin. 2. Otherwise, no acute abnormality identified within the abdomen or pelvis. 3. 1 cm nodular opacity  within the RIGHT middle lobe on the 1st image. This cannot be definitively characterized as atelectasis on this single images and chest CT with contrast is recommended for further evaluation. 4.  Aortic Atherosclerosis (ICD10-I70.0). Electronically Signed   By: Margarette Canada M.D.   On: 10/24/2018 14:10    Orson Eva, DO  [facility] Pager [phone]  If 7PM-7AM, please contact night-coverage [email or web address] Password [email or web address] 11/07/2018, 11:30 AM   LOS: 0 days

## 2018-11-08 LAB — BASIC METABOLIC PANEL
Anion gap: 7 (ref 5–15)
BUN: 16 mg/dL (ref 8–23)
CO2: 24 mmol/L (ref 22–32)
Calcium: 8.1 mg/dL — ABNORMAL LOW (ref 8.9–10.3)
Chloride: 112 mmol/L — ABNORMAL HIGH (ref 98–111)
Creatinine, Ser: 0.9 mg/dL (ref 0.44–1.00)
GFR calc Af Amer: >60 mL/min (ref >60)
GFR calc non Af Amer: 58 mL/min — ABNORMAL LOW (ref >60)
Glucose, Bld: 100 mg/dL — ABNORMAL HIGH (ref 70–99)
Potassium: 3.6 mmol/L (ref 3.5–5.1)
Sodium: 143 mmol/L (ref 135–145)

## 2018-11-08 LAB — COPPER, SERUM: Copper: 167 ug/dL — ABNORMAL HIGH (ref 72–166)

## 2018-11-08 LAB — MAGNESIUM: Magnesium: 1.8 mg/dL (ref 1.7–2.4)

## 2018-11-08 MED ORDER — METOPROLOL TARTRATE 25 MG PO TABS: 25.0000 mg | ORAL_TABLET | Freq: Two times a day (BID) | ORAL | 1 refills | Status: AC

## 2018-11-08 NOTE — Progress Notes (Signed)
IV removed and discharge instructions reviewed.  Follow up appts made and scripts sent to pharmacy.  Daughter here to give ride home

## 2018-11-08 NOTE — Discharge Summary (Signed)
Physician Discharge Summary  HOLLIS OH TIR:[id number] DOB: 07/13/1931 DOA: 11/05/2018  PCP: Neale Burly, MD  Admit date: 11/05/2018 Discharge date: 11/08/2018  Admitted From: Home Disposition:  Home   Recommendations for Outpatient Follow-up:  1. Follow up with PCP in 1-2 weeks 2. Please obtain BMP/CBC in one week   Home Health: Home with Hospice   Discharge Condition: Stable CODE STATUS: DNR Diet recommendation: Regular   Brief/Interim Summary: 86 year old female with a history of CKD stage III, chronic back pain, hyperlipidemia presenting with 1 to 40-month history of progressive generalized weakness, intermittent dizziness, and decreased oral intake. The patient has had some intermittent nausea with occasional loose stools. She denied any hematochezia, melena, fevers, chills, chest pain, shortness of breath, hematemesis, dysuria, hematuria. The patient lives by herself and has been resistant to see her physicians. The patient is able to manage her ADLs independently. She states that she does not use any assistive devices. The patient was seen by Dr. Delton Coombes in the emergency department due to the above symptoms. He ordered LDH, reticulocyte count, SPEP, copper level, and methylmalonic acid levels. CT of the abdomen and pelvis on 10/24/2018 showed a small amount nonspecific free fluid in the pelvis with intermittent origin. There was also indication of a 1 cm opacity in the right middle lobe that was incompletely characterized. CT of the chest on 11/06/2018 showed bulky mediastinal, right hilar, and paratracheal lymphadenopathy with a vague groundglass opacity in right upper lobe. Pulmonary medicine was consulted to assist with management. Dr. Delton Coombes was also reconsulted. Palliative medicine was consulted to assist with management.  After extensive discussion with the patient and her daughter, the patient had made a decision not to pursue any further diagnostic or  aggressive therapeutic options regarding her possible lung cancer.  She made her decision and also to pulmonary medicine as well as to medical oncology.  Palliative medicine helped assist with transitioning to home hospice. On the evening of 11/05/2016 and the morning of 11/07/2018, the patient had numerous episodes of SVT with heart rate up into the 150s.  Fortunately, the patient remained hemodynamically stable.  As result, the patient's discharge was delayed for 24 hours to optimize her heart rate control prior to discharge.  She was started on metoprolol 25 mg bid with improvement in HR.   Discharge Diagnoses:  Symptomatic anemia -Presented with hemoglobin 7.1 -Patient had hemoglobin 10.3 on 08/09/2017 -Stool for occult blood was negative in the emergency department -Patient has never had a colonoscopy -She reportedly received a blood transfusion when she was hospitalized in [place] 2 months ago -Repeat FOBT--neg -Iron saturation 5%, ferritin 676, P95 093, folic acid 26.7 -Give Nulecit x 1 -Hemoglobin remained stable after transfusion  SVT -Patient had numerous episodes of SVT with HR up to 150s--personally reviewed telemetry -Patient is essentially asymptomatic -TSH 8.681, free T4 0.81 -9/11 echo--EF 12-45%, +diastolic dysfx, mod TR,AS -Start metoprolol tartrate 25 mg bid with improvement in HR -Magnesium 2.1  Failure to thrive -TSH 3.681 -Check free T4--0.81 -PT evaluation-->HHPT  Mediastinal and hilar lymphadenopathy -Appreciate pulmonary consult-->will ultimately need mediastinoscopy -Discussed with the patient concern for possible lung cancer or other malignancy -Currently refuses any type of invasive procedures or biopsy -I have spoken extensively with the patient as well as her daughter -Certainly, this can be followed up as an outpatient and mediastinoscopy can be set up as an outpatient if the patient continues to refuse invasive intervention at this time -Reconsult Dr.  Verdie Mosher with palliative care and  home with hospice  CKD stage III -Baseline creatinine 1.0-1.2  Elevated blood pressure -Monitor clinically for now -Started metoprolol for SVT management  Thrombocytosis -There is a component of iron deficiency contributing to this  Chronic low back pain -Patient states that this is stable and actually a little bit better -PT evaluation -She takes acetaminophen for at home which will be continued  Severe protein calorie malnutrition -Started Ensure   Discharge Instructions   Allergies as of 11/08/2018   No Known Allergies     Medication List    TAKE these medications   acetaminophen 500 MG tablet Commonly known as: TYLENOL Take 500-1,000 mg by mouth every 6 (six) hours as needed.   feeding supplement (ENSURE ENLIVE) Liqd Take 237 mLs by mouth 2 (two) times daily between meals.   metoprolol tartrate 25 MG tablet Commonly known as: LOPRESSOR Take 1 tablet (25 mg total) by mouth 2 (two) times daily.       No Known Allergies  Consultations:  Palliative  Pulmonary  Med/onc   Procedures/Studies: Ct Chest W Contrast  Result Date: 11/05/2018 CLINICAL DATA:  Lung nodule noted on CT abdomen. EXAM: CT CHEST WITH CONTRAST TECHNIQUE: Multidetector CT imaging of the chest was performed during intravenous contrast administration. CONTRAST:  72mL OMNIPAQUE IOHEXOL 300 MG/ML  SOLN COMPARISON:  CT abdomen 10/24/2018 FINDINGS: Cardiovascular: Diffuse aortic and coronary artery atherosclerosis. Heart is normal size. Aorta is normal caliber. Mediastinum/Nodes: Bulky right hilar adenopathy. Index posterior right hilar lymph node has a short axis diameter of 2 cm. Other similarly sized right hilar lymph nodes. Right paratracheal adenopathy with index right paratracheal lymph node having a short axis diameter of 13 mm. No axillary adenopathy. Subcarinal adenopathy noted. Lungs/Pleura: Anterior right middle lobe nodule measures 13 mm in  greatest diameter. Vague ground-glass area posteriorly in the right upper lobe. Left lung clear. No effusions. Upper Abdomen: Diffuse low-density throughout the liver, likely fatty infiltration. Cysts in the kidneys bilaterally with cortical thinning/scarring. Musculoskeletal: Chest wall soft tissues are unremarkable. No acute bony abnormality. IMPRESSION: 13 mm anterior right middle lobe nodule. Bulky right hilar adenopathy. Mediastinal adenopathy. Findings most suggestive of lung cancer. Vague ground-glass area in the posterior right upper lobe. This could reflect an area of inflammation although low-grade adenocarcinoma can have this appearance. Recommend attention on follow-up imaging. Coronary artery disease. Aortic Atherosclerosis (ICD10-I70.0). Electronically Signed   By: Rolm Baptise M.D.   On: 11/05/2018 21:56   Ct Abdomen Pelvis W Contrast  Result Date: 10/24/2018 CLINICAL DATA:  85 year old female with acute diffuse abdominal and pelvic pain. EXAM: CT ABDOMEN AND PELVIS WITH CONTRAST TECHNIQUE: Multidetector CT imaging of the abdomen and pelvis was performed using the standard protocol following bolus administration of intravenous contrast. CONTRAST:  47mL OMNIPAQUE IOHEXOL 300 MG/ML  SOLN COMPARISON:  06/25/2015 CT and prior studies FINDINGS: Lower chest: On the 1st image, a 1 cm nodular opacity is identified within the RIGHT middle lobe. Hepatobiliary: The liver and gallbladder are unremarkable. No biliary dilatation. Pancreas: Unremarkable Spleen: Unremarkable Adrenals/Urinary Tract: Bilateral renal cortical thinning and renal cysts are present. Renal vascular calcifications are noted. No evidence of hydronephrosis or obstructing urinary calculi. Stomach/Bowel: Stomach is within normal limits. The appendix is not definitely identified. No evidence of bowel wall thickening, distention, or inflammatory changes. Vascular/Lymphatic: Aortic atherosclerosis. No enlarged abdominal or pelvic lymph nodes.  Reproductive: Uterus and bilateral adnexa are unremarkable. Other: A small amount of free fluid within the pelvis is nonspecific. No evidence of focal collection or  pneumoperitoneum. Musculoskeletal: No acute or suspicious bony abnormalities. Degenerative changes within the lumbar spine identified. IMPRESSION: 1. Small amount of nonspecific free fluid within the pelvis - indeterminate origin. 2. Otherwise, no acute abnormality identified within the abdomen or pelvis. 3. 1 cm nodular opacity within the RIGHT middle lobe on the 1st image. This cannot be definitively characterized as atelectasis on this single images and chest CT with contrast is recommended for further evaluation. 4.  Aortic Atherosclerosis (ICD10-I70.0). Electronically Signed   By: Margarette Canada M.D.   On: 10/24/2018 14:10         Discharge Exam: Vitals:   11/07/18 2121 11/08/18 0436  BP: (!) 168/81 (!) 152/70  Pulse: 99 86  Resp: 18 16  Temp: 99.7 F (37.6 C) 98.1 F (36.7 C)  SpO2: 99% 100%   Vitals:   11/07/18 1944 11/07/18 2121 11/08/18 0436 11/08/18 0600  BP:  (!) 168/81 (!) 152/70   Pulse:  99 86   Resp:  18 16   Temp:  99.7 F (37.6 C) 98.1 F (36.7 C)   TempSrc:  Oral Oral   SpO2: 95% 99% 100%   Weight:    36.9 kg  Height:        General: Pt is alert, awake, not in acute distress Cardiovascular: RRR, S1/S2 +, no rubs, no gallops Respiratory: diminished breath sounds bilateral.  No wheeze.  Bibasilar rales Abdominal: Soft, NT, ND, bowel sounds + Extremities: no edema, no cyanosis   The results of significant diagnostics from this hospitalization (including imaging, microbiology, ancillary and laboratory) are listed below for reference.    Significant Diagnostic Studies: Ct Chest W Contrast  Result Date: 11/05/2018 CLINICAL DATA:  Lung nodule noted on CT abdomen. EXAM: CT CHEST WITH CONTRAST TECHNIQUE: Multidetector CT imaging of the chest was performed during intravenous contrast administration.  CONTRAST:  65mL OMNIPAQUE IOHEXOL 300 MG/ML  SOLN COMPARISON:  CT abdomen 10/24/2018 FINDINGS: Cardiovascular: Diffuse aortic and coronary artery atherosclerosis. Heart is normal size. Aorta is normal caliber. Mediastinum/Nodes: Bulky right hilar adenopathy. Index posterior right hilar lymph node has a short axis diameter of 2 cm. Other similarly sized right hilar lymph nodes. Right paratracheal adenopathy with index right paratracheal lymph node having a short axis diameter of 13 mm. No axillary adenopathy. Subcarinal adenopathy noted. Lungs/Pleura: Anterior right middle lobe nodule measures 13 mm in greatest diameter. Vague ground-glass area posteriorly in the right upper lobe. Left lung clear. No effusions. Upper Abdomen: Diffuse low-density throughout the liver, likely fatty infiltration. Cysts in the kidneys bilaterally with cortical thinning/scarring. Musculoskeletal: Chest wall soft tissues are unremarkable. No acute bony abnormality. IMPRESSION: 13 mm anterior right middle lobe nodule. Bulky right hilar adenopathy. Mediastinal adenopathy. Findings most suggestive of lung cancer. Vague ground-glass area in the posterior right upper lobe. This could reflect an area of inflammation although low-grade adenocarcinoma can have this appearance. Recommend attention on follow-up imaging. Coronary artery disease. Aortic Atherosclerosis (ICD10-I70.0). Electronically Signed   By: Rolm Baptise M.D.   On: 11/05/2018 21:56   Ct Abdomen Pelvis W Contrast  Result Date: 10/24/2018 CLINICAL DATA:  85 year old female with acute diffuse abdominal and pelvic pain. EXAM: CT ABDOMEN AND PELVIS WITH CONTRAST TECHNIQUE: Multidetector CT imaging of the abdomen and pelvis was performed using the standard protocol following bolus administration of intravenous contrast. CONTRAST:  91mL OMNIPAQUE IOHEXOL 300 MG/ML  SOLN COMPARISON:  06/25/2015 CT and prior studies FINDINGS: Lower chest: On the 1st image, a 1 cm nodular opacity is  identified within  the RIGHT middle lobe. Hepatobiliary: The liver and gallbladder are unremarkable. No biliary dilatation. Pancreas: Unremarkable Spleen: Unremarkable Adrenals/Urinary Tract: Bilateral renal cortical thinning and renal cysts are present. Renal vascular calcifications are noted. No evidence of hydronephrosis or obstructing urinary calculi. Stomach/Bowel: Stomach is within normal limits. The appendix is not definitely identified. No evidence of bowel wall thickening, distention, or inflammatory changes. Vascular/Lymphatic: Aortic atherosclerosis. No enlarged abdominal or pelvic lymph nodes. Reproductive: Uterus and bilateral adnexa are unremarkable. Other: A small amount of free fluid within the pelvis is nonspecific. No evidence of focal collection or pneumoperitoneum. Musculoskeletal: No acute or suspicious bony abnormalities. Degenerative changes within the lumbar spine identified. IMPRESSION: 1. Small amount of nonspecific free fluid within the pelvis - indeterminate origin. 2. Otherwise, no acute abnormality identified within the abdomen or pelvis. 3. 1 cm nodular opacity within the RIGHT middle lobe on the 1st image. This cannot be definitively characterized as atelectasis on this single images and chest CT with contrast is recommended for further evaluation. 4.  Aortic Atherosclerosis (ICD10-I70.0). Electronically Signed   By: Margarette Canada M.D.   On: 10/24/2018 14:10     Microbiology: Recent Results (from the past 240 hour(s))  SARS Coronavirus 2 [facility] order, Performed in [facility] hospital lab) Nasopharyngeal Nasopharyngeal Swab     Status: None   Collection Time: 11/05/18  6:18 PM   Specimen: Nasopharyngeal Swab  Result Value Ref Range Status   SARS Coronavirus 2 NEGATIVE NEGATIVE Final    Comment: (NOTE) If result is NEGATIVE SARS-CoV-2 target nucleic acids are NOT DETECTED. The SARS-CoV-2 RNA is generally detectable in upper and lower  respiratory specimens during the  acute phase of infection. The lowest  concentration of SARS-CoV-2 viral copies this assay can detect is 250  copies / mL. A negative result does not preclude SARS-CoV-2 infection  and should not be used as the sole basis for treatment or other  patient management decisions.  A negative result may occur with  improper specimen collection / handling, submission of specimen other  than nasopharyngeal swab, presence of viral mutation(s) within the  areas targeted by this assay, and inadequate number of viral copies  (<250 copies / mL). A negative result must be combined with clinical  observations, patient history, and epidemiological information. If result is POSITIVE SARS-CoV-2 target nucleic acids are DETECTED. The SARS-CoV-2 RNA is generally detectable in upper and lower  respiratory specimens dur ing the acute phase of infection.  Positive  results are indicative of active infection with SARS-CoV-2.  Clinical  correlation with patient history and other diagnostic information is  necessary to determine patient infection status.  Positive results do  not rule out bacterial infection or co-infection with other viruses. If result is PRESUMPTIVE POSTIVE SARS-CoV-2 nucleic acids MAY BE PRESENT.   A presumptive positive result was obtained on the submitted specimen  and confirmed on repeat testing.  While 2019 novel coronavirus  (SARS-CoV-2) nucleic acids may be present in the submitted sample  additional confirmatory testing may be necessary for epidemiological  and / or clinical management purposes  to differentiate between  SARS-CoV-2 and other Sarbecovirus currently known to infect humans.  If clinically indicated additional testing with an alternate test  methodology [phone]) is advised. The SARS-CoV-2 RNA is generally  detectable in upper and lower respiratory sp ecimens during the acute  phase of infection. The expected result is Negative. Fact Sheet for Patients:   StrictlyIdeas.no Fact Sheet for Healthcare Providers: BankingDealers.co.za This test is not  yet approved or cleared by the [place] and has been authorized for detection and/or diagnosis of SARS-CoV-2 by FDA under an Emergency Use Authorization (EUA).  This EUA will remain in effect (meaning this test can be used) for the duration of the COVID-19 declaration under Section 564(b)(1) of the Act, 21 U.S.C. section 360bbb-3(b)(1), unless the authorization is terminated or revoked sooner. Performed at [facility], [address] [place], [place] [address]      Labs: Basic Metabolic Panel: Recent Labs  Lab 11/05/18 1509 11/06/18 0458 11/08/18 0657  NA 139 137 143  K 5.4* 4.8 3.6  CL 105 107 112*  CO2 22 22 24   GLUCOSE 123* 82 100*  BUN 26* 27* 16  CREATININE 1.30* 1.21* 0.90  CALCIUM 9.2 8.2* 8.1*  MG  --  2.1 1.8  PHOS  --  3.8  --    Liver Function Tests: Recent Labs  Lab 11/05/18 1509  AST 16  ALT 13  ALKPHOS 103  BILITOT 0.7  PROT 7.3  ALBUMIN 2.7*   No results for input(s): LIPASE, AMYLASE in the last 168 hours. No results for input(s): AMMONIA in the last 168 hours. CBC: Recent Labs  Lab 11/05/18 1509 11/06/18 0458 11/07/18 0458  WBC 11.3* 12.1* 11.2*  NEUTROABS 10.1*  --   --   HGB 7.1* 8.9* 8.8*  HCT 23.9* 28.7* 28.4*  MCV 95.2 90.5 91.0  PLT 705* 629* 628*   Cardiac Enzymes: No results for input(s): CKTOTAL, CKMB, CKMBINDEX, TROPONINI in the last 168 hours. BNP: Invalid input(s): POCBNP CBG: No results for input(s): GLUCAP in the last 168 hours.  Time coordinating discharge:  36 minutes  Signed:  Orson Eva, DO [facility] Pager: [phone] 11/08/2018, 5:54 PM

## 2018-11-20 ENCOUNTER — Ambulatory Visit (HOSPITAL_COMMUNITY): Payer: Medicare HMO

## 2018-11-27 ENCOUNTER — Ambulatory Visit (HOSPITAL_COMMUNITY): Payer: Medicare HMO | Admitting: Hematology

## 2018-12-28 DEATH — deceased

## 2020-01-08 IMAGING — CT CT ABDOMEN AND PELVIS WITH CONTRAST
2 of 5 series · 16 of 46 positions shown, 18 images · IV contrast (omnipaque)
Comparison: 06/25/2015 CT and prior studies

CLINICAL DATA: 87-year-old female with acute diffuse abdominal and
pelvic pain.

EXAM:
CT ABDOMEN AND PELVIS WITH CONTRAST
TECHNIQUE: Multidetector CT imaging of the abdomen and pelvis was performed
using the standard protocol following bolus administration of
intravenous contrast.
CONTRAST:  60mL OMNIPAQUE IOHEXOL 300 MG/ML  SOLN

[Series 2: axial st · axial · 0.57mm/px · z∈[-338,-53]mm · 13 of 67 slices shown, 15 images]
[im 5/67  soft-tissue]
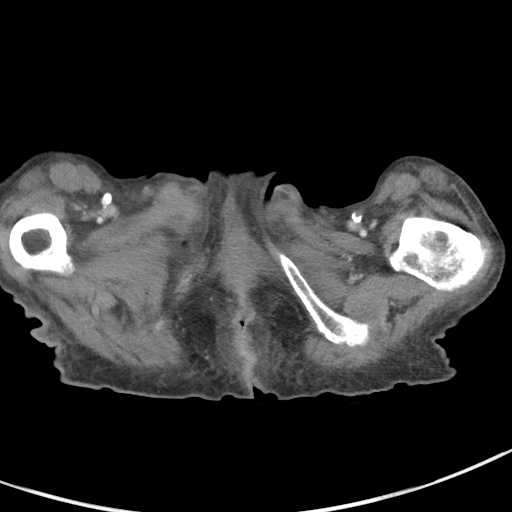
[im 5/67  bone]
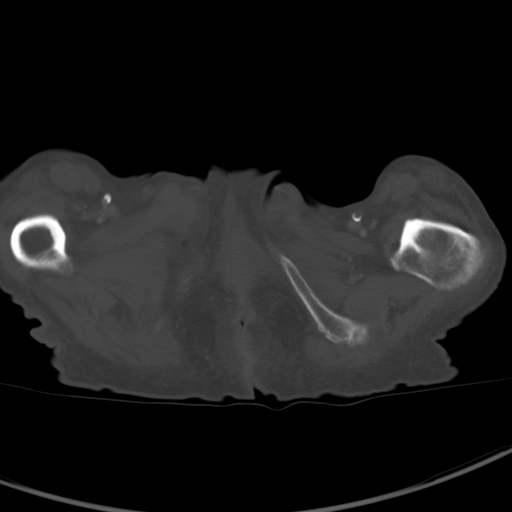
[im 10/67  soft-tissue]
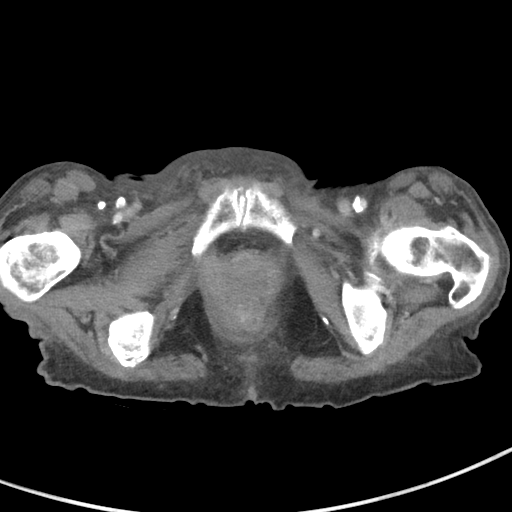
[im 15/67  soft-tissue]
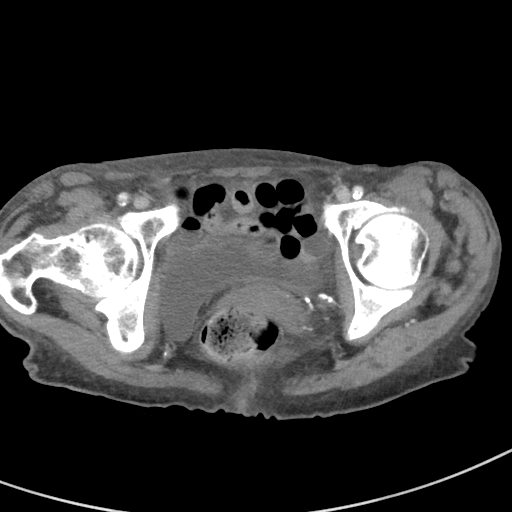
[im 19/67  soft-tissue]
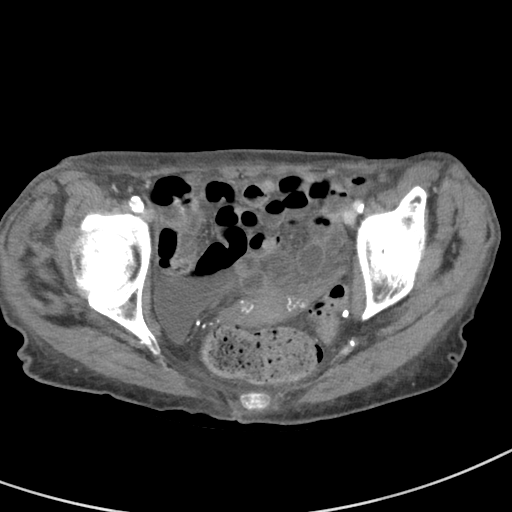
[im 24/67  soft-tissue]
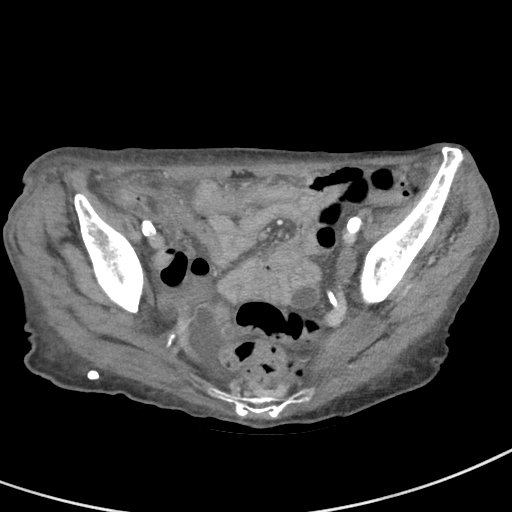
[im 29/67  soft-tissue]
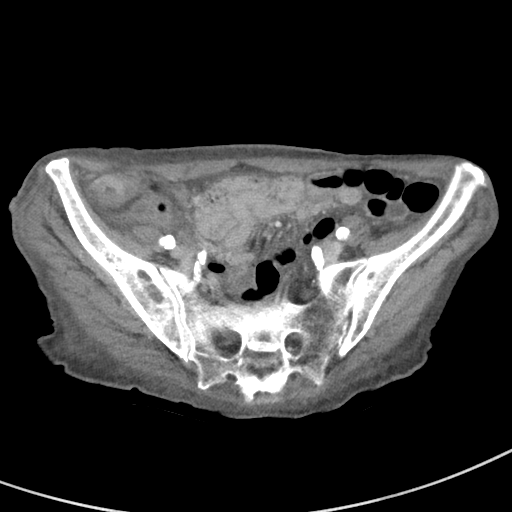
[im 34/67  soft-tissue]
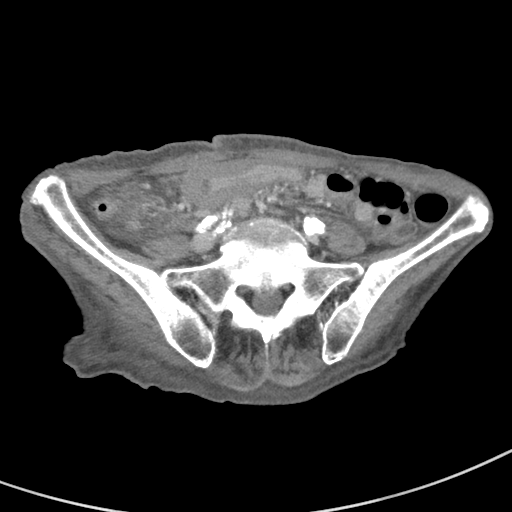
[im 38/67  soft-tissue]
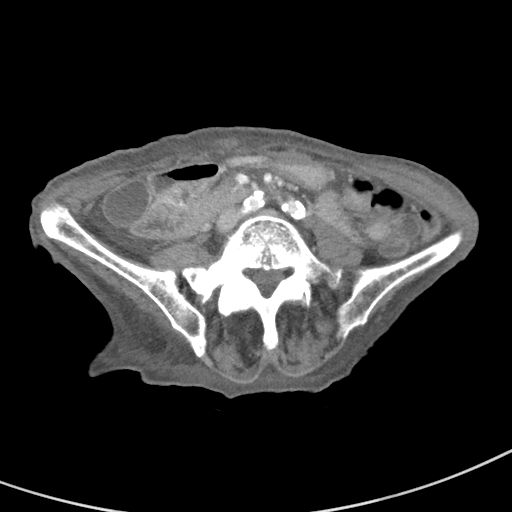
[im 43/67  soft-tissue]
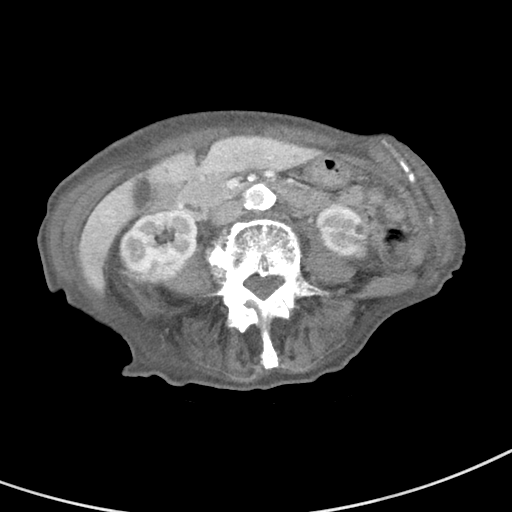
[im 43/67  bone]
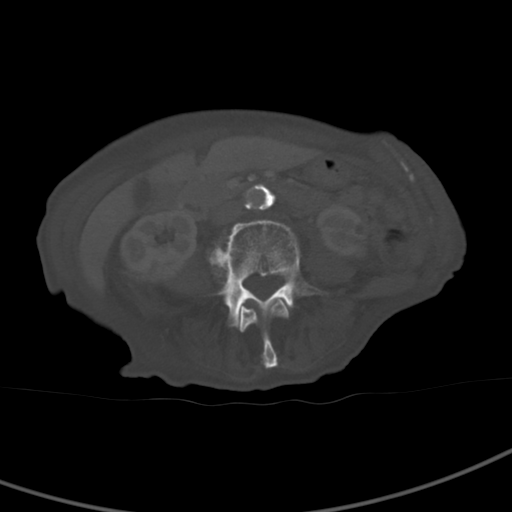
[im 48/67  soft-tissue]
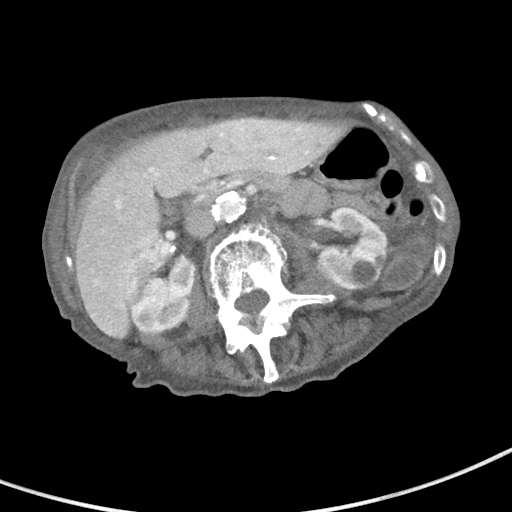
[im 52/67  soft-tissue]
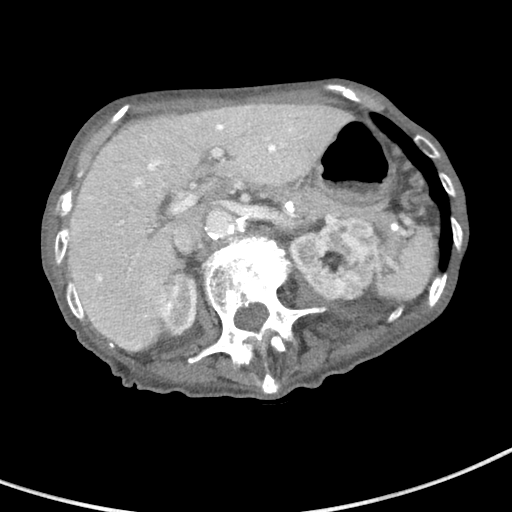
[im 57/67  soft-tissue]
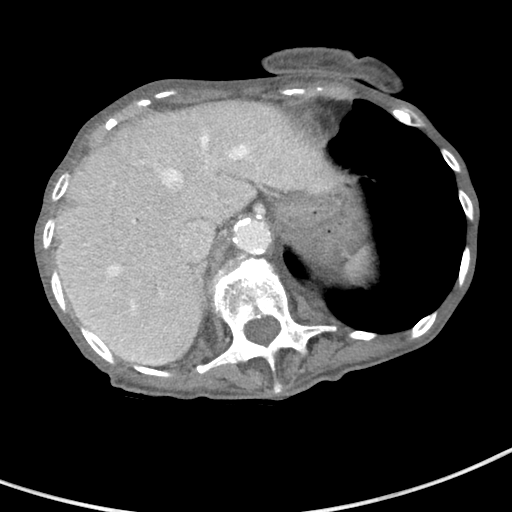
[im 62/67  soft-tissue]
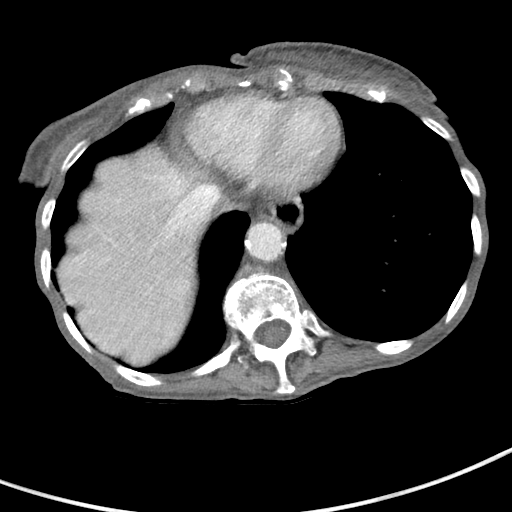

[Series 5: coronal st · coronal · 0.61mm/px · 3 of 75 slices shown]
[im 25/75  soft-tissue]
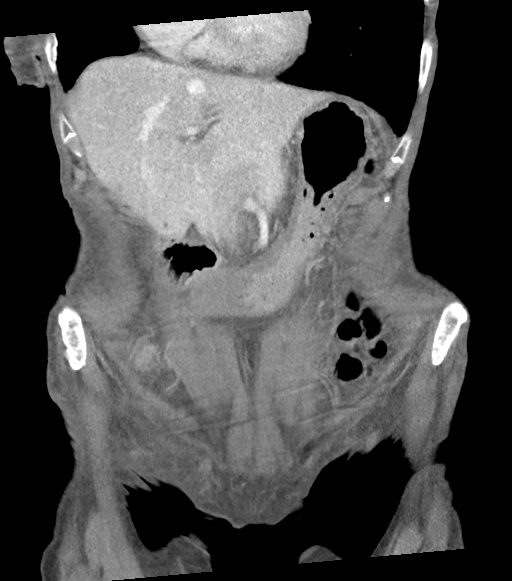
[im 33/75  soft-tissue]
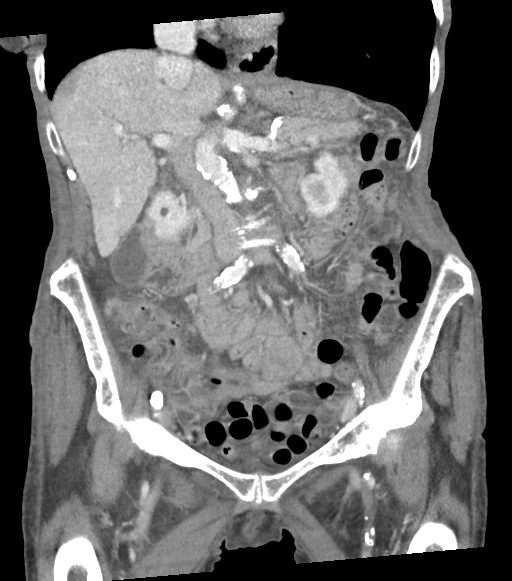
[im 42/75  soft-tissue]
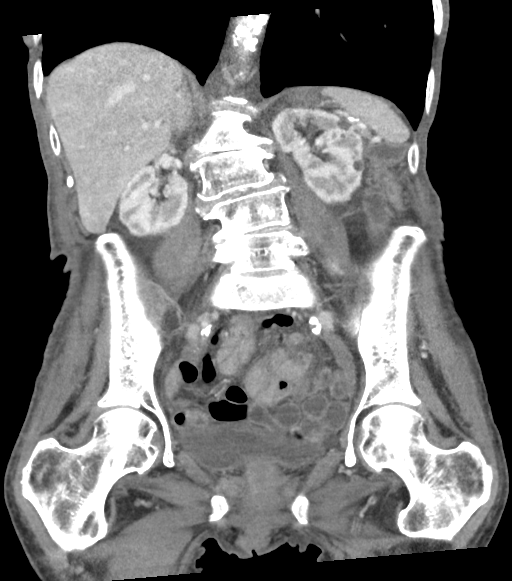

[16 of 46 positions shown; findings below may reference images not displayed]

FINDINGS: Lower chest: On the 1st image, a 1 cm nodular opacity is identified
within the RIGHT middle lobe.

Hepatobiliary: The liver and gallbladder are unremarkable. No
biliary dilatation.

Pancreas: Unremarkable

Spleen: Unremarkable

Adrenals/Urinary Tract: Bilateral renal cortical thinning and renal
cysts are present. Renal vascular calcifications are noted. No
evidence of hydronephrosis or obstructing urinary calculi.

Stomach/Bowel: Stomach is within normal limits. The appendix is not
definitely identified. No evidence of bowel wall thickening,
distention, or inflammatory changes.

Vascular/Lymphatic: Aortic atherosclerosis. No enlarged abdominal or
pelvic lymph nodes.

Reproductive: Uterus and bilateral adnexa are unremarkable.

Other: A small amount of free fluid within the pelvis is
nonspecific. No evidence of focal collection or pneumoperitoneum.

Musculoskeletal: No acute or suspicious bony abnormalities.
Degenerative changes within the lumbar spine identified.
IMPRESSION: 1. Small amount of nonspecific free fluid within the pelvis -
indeterminate origin.
2. Otherwise, no acute abnormality identified within the abdomen or
pelvis.
3. 1 cm nodular opacity within the RIGHT middle lobe on the 1st
image. This cannot be definitively characterized as atelectasis on
this single images and chest CT with contrast is recommended for
further evaluation.
4.  Aortic Atherosclerosis ([id number]).
